# Patient Record
Sex: Female | Born: 1985 | Race: Black or African American | Hispanic: No | Marital: Single | Smoking: Current every day smoker
Health system: Southern US, Community
[De-identification: ages and names within clinical notes are randomized; demographics above are authoritative.]

## PROBLEM LIST (undated history)

## (undated) ENCOUNTER — Ambulatory Visit: Source: Home / Self Care

## (undated) DIAGNOSIS — D649 Anemia, unspecified: Secondary | ICD-10-CM

## (undated) HISTORY — DX: Anemia, unspecified: D64.9

## (undated) HISTORY — PX: DILATION AND CURETTAGE OF UTERUS: SHX78

---

## 2013-02-08 ENCOUNTER — Encounter (HOSPITAL_COMMUNITY): Payer: Self-pay | Admitting: Emergency Medicine

## 2013-02-08 ENCOUNTER — Emergency Department (INDEPENDENT_AMBULATORY_CARE_PROVIDER_SITE_OTHER)
Admission: EM | Admit: 2013-02-08 | Discharge: 2013-02-08 | Disposition: A | Discharge status: Home / Self Care | Payer: Self-pay | Source: Home / Self Care | Attending: Emergency Medicine | Admitting: Emergency Medicine

## 2013-02-08 DIAGNOSIS — L0291 Cutaneous abscess, unspecified: Secondary | ICD-10-CM

## 2013-02-08 DIAGNOSIS — L0390 Cellulitis, unspecified: Secondary | ICD-10-CM

## 2013-02-08 MED ORDER — HYDROCODONE-ACETAMINOPHEN 5-325 MG PO TABS
ORAL_TABLET | ORAL | Status: AC
  Filled 2013-02-08: qty 2

## 2013-02-08 MED ORDER — HYDROCODONE-ACETAMINOPHEN 5-325 MG PO TABS
2.0000 | ORAL_TABLET | Freq: Once | ORAL | Status: AC
  Administered 2013-02-08: 2 via ORAL

## 2013-02-08 MED ORDER — SULFAMETHOXAZOLE-TMP DS 800-160 MG PO TABS: 2.0000 | ORAL_TABLET | Freq: Two times a day (BID) | ORAL | Status: DC

## 2013-02-08 MED ORDER — OXYCODONE-ACETAMINOPHEN 5-325 MG PO TABS: ORAL_TABLET | ORAL | Status: DC

## 2013-02-08 NOTE — Discharge Instructions (Signed)

## 2013-02-08 NOTE — ED Notes (Signed)
Swollen, red area in right axilla area

## 2013-02-08 NOTE — ED Provider Notes (Signed)
  Chief Complaint   Chief Complaint  Patient presents with  . Cellulitis    History of Present Illness   Emily Rose is a 28 year old female who has had a two-day history of a painful abscess in her right axilla. She's had smaller abscesses in this area before, but they have not required any medical treatment. The current abscess has not drained and she denies any fever or chills. She denies any other skin lesions elsewhere on her body.  Review of Systems   Other than as noted above, the patient denies any of the following symptoms: Systemic:  No fever, chills or sweats. Skin:  No rash or itching.  [place]   Past medical history, family history, social history, meds, and allergies were reviewed.  No history of diabetes or prior history of abscesses or MRSA.  Physical Examination     Vital signs:  BP 105/41  Pulse 74  Temp(Src) 98.5 F (36.9 C) (Oral)  Resp 19  SpO2 96% Skin:  There is a large, tender abscess in the right axilla measuring 4 x 5 cm. This was fluctuant and tender but not draining.  Skin exam was otherwise normal.  No rash. Ext:  Distal pulses were full, patient has full ROM of all joints.  Procedure   Verbal informed consent was obtained.  The patient was informed of the risks and benefits of the procedure and understands and accepts.  A time out was called and the identity of the patient and correct procedure was confirmed.   The abscess area described above was prepped with Betadine and alcohol and anesthetized with ethyl chloride spray and 5 mL of 2% Xylocaine without epinephrine.  Using a #11 scalpel blade, a singe straight incision was made into the area of fluctulence, yielding a large amount of malodorous prurulent drainage.  Routine cultures were obtained.  Blunt dissection was used to break up loculations and the resulting wound cavity was packed with 1/4 inch Iodoform gauze.  A sterile pressure dressing was applied.  Course in [facility]   She  was given Norco 5/325 2 by mouth for pain.  Assessment   The encounter diagnosis was Abscess.  Plan     1.  Meds:  The following meds were prescribed:   Discharge Medication List as of 02/08/2013  3:53 PM    START taking these medications   Details  oxyCODONE-acetaminophen (PERCOCET) 5-325 MG per tablet 1 to 2 tablets every 6 hours as needed for pain., Print    sulfamethoxazole-trimethoprim (BACTRIM DS) 800-160 MG per tablet Take 2 tablets by mouth 2 (two) times daily., Starting 02/08/2013, Until Discontinued, Normal        2.  Patient Education/Counseling:  The patient was given appropriate handouts, self care instructions, and instructed in symptomatic relief.   3.  Follow up:  The patient was instructed to leave the dressing in place and return here again in 48 hours for packing removal, or sooner if becoming worse in any way, and given some red flag symptoms such as fever which would prompt immediate return.       Harden Mo, MD 02/08/13 2227

## 2013-02-10 ENCOUNTER — Emergency Department (INDEPENDENT_AMBULATORY_CARE_PROVIDER_SITE_OTHER)
Admission: EM | Admit: 2013-02-10 | Discharge: 2013-02-10 | Disposition: A | Discharge status: Home / Self Care | Payer: Self-pay | Source: Home / Self Care | Attending: Family Medicine | Admitting: Family Medicine

## 2013-02-10 ENCOUNTER — Encounter (HOSPITAL_COMMUNITY): Payer: Self-pay | Admitting: Emergency Medicine

## 2013-02-10 DIAGNOSIS — Z09 Encounter for follow-up examination after completed treatment for conditions other than malignant neoplasm: Secondary | ICD-10-CM

## 2013-02-10 NOTE — Discharge Instructions (Signed)
Warm compress twice a day when you take the antibiotic, take all of medicine, return as needed. °

## 2013-02-10 NOTE — ED Notes (Signed)
Patient to be seen for reevaluation of boil, right axilla.

## 2013-02-10 NOTE — ED Provider Notes (Signed)
CSN: [id number]     Arrival date & time 02/10/13  1435 History   None    Chief Complaint  Patient presents with  . Abscess   (Consider location/radiation/quality/duration/timing/severity/associated sxs/prior Treatment) Patient is a 28 y.o. female presenting with wound check. The history is provided by the patient.  Wound Check This is a new problem. The current episode started 2 days ago. The problem has been rapidly improving.    History reviewed. No pertinent past medical history. History reviewed. No pertinent past surgical history. No family history on file. History  Substance Use Topics  . Smoking status: Current Every Day Smoker  . Smokeless tobacco: Not on file  . Alcohol Use: Yes   OB History   Grav Para Term Preterm Abortions TAB SAB Ect Mult Living                 Review of Systems  Constitutional: Negative.   Skin: Positive for wound.    Allergies  Review of patient's allergies indicates no known allergies.  Home Medications   Current Outpatient Rx  Name  Route  Sig  Dispense  Refill  . oxyCODONE-acetaminophen (PERCOCET) 5-325 MG per tablet      1 to 2 tablets every 6 hours as needed for pain.   20 tablet   0   . sulfamethoxazole-trimethoprim (BACTRIM DS) 800-160 MG per tablet   Oral   Take 2 tablets by mouth 2 (two) times daily.   40 tablet   0    BP 123/73  Pulse 60  Temp(Src) 98.5 F (36.9 C) (Oral)  Resp 16  SpO2 98% Physical Exam  Nursing note and vitals reviewed. Constitutional: She is oriented to person, place, and time. She appears well-developed and well-nourished. No distress.  Neurological: She is alert and oriented to person, place, and time.  Skin: Skin is warm and dry.  Abscess packed right axilla, mod drainage on dsg.    ED Course  Procedures (including critical care time) Labs Review Labs Reviewed - No data to display Imaging Review No results found.  EKG Interpretation    Date/Time:    Ventricular Rate:    PR  Interval:    QRS Duration:   QT Interval:    QTC Calculation:   R Axis:     Text Interpretation:              MDM  Packing removed, no drainage, sx marked improved.    Billy Fischer, MD 02/13/13 [phone]

## 2013-02-11 LAB — CULTURE, ROUTINE-ABSCESS: SPECIAL REQUESTS: NORMAL

## 2013-02-12 NOTE — ED Notes (Addendum)
Abscess culture R arm: Few Proteus Mirabilis.  Pt. adequately treated with I and D and Bactrim DS. Roselyn Meier 02/12/2013

## 2013-04-27 ENCOUNTER — Other Ambulatory Visit (HOSPITAL_COMMUNITY)
Admission: RE | Admit: 2013-04-27 | Discharge: 2013-04-27 | Disposition: A | Discharge status: Home / Self Care | Payer: Self-pay | Source: Ambulatory Visit | Attending: Family Medicine | Admitting: Family Medicine

## 2013-04-27 ENCOUNTER — Encounter (HOSPITAL_COMMUNITY): Payer: Self-pay | Admitting: Emergency Medicine

## 2013-04-27 ENCOUNTER — Emergency Department (INDEPENDENT_AMBULATORY_CARE_PROVIDER_SITE_OTHER)
Admission: EM | Admit: 2013-04-27 | Discharge: 2013-04-27 | Disposition: A | Discharge status: Home / Self Care | Payer: Self-pay | Source: Home / Self Care | Attending: Family Medicine | Admitting: Family Medicine

## 2013-04-27 DIAGNOSIS — N390 Urinary tract infection, site not specified: Secondary | ICD-10-CM

## 2013-04-27 DIAGNOSIS — Z113 Encounter for screening for infections with a predominantly sexual mode of transmission: Secondary | ICD-10-CM | POA: Insufficient documentation

## 2013-04-27 DIAGNOSIS — N760 Acute vaginitis: Secondary | ICD-10-CM | POA: Insufficient documentation

## 2013-04-27 LAB — POCT URINALYSIS DIP (DEVICE)
BILIRUBIN URINE: NEGATIVE
Glucose, UA: NEGATIVE mg/dL
Ketones, ur: NEGATIVE mg/dL
NITRITE: NEGATIVE
Protein, ur: NEGATIVE mg/dL
Specific Gravity, Urine: 1.015 (ref 1.005–1.030)
Urobilinogen, UA: 0.2 mg/dL (ref 0.0–1.0)
pH: 8.5 — ABNORMAL HIGH (ref 5.0–8.0)

## 2013-04-27 LAB — POCT PREGNANCY, URINE: Preg Test, Ur: NEGATIVE

## 2013-04-27 MED ORDER — FLUCONAZOLE 150 MG PO TABS: 150.0000 mg | ORAL_TABLET | Freq: Once | ORAL | Status: DC

## 2013-04-27 MED ORDER — CEPHALEXIN 500 MG PO CAPS: 500.0000 mg | ORAL_CAPSULE | Freq: Four times a day (QID) | ORAL | Status: DC

## 2013-04-27 NOTE — ED Provider Notes (Signed)
CSN: [id number]     Arrival date & time 04/27/13  1223 History   First MD Initiated Contact with Patient 04/27/13 1505     Chief Complaint  Patient presents with  . Back Pain   (Consider location/radiation/quality/duration/timing/severity/associated sxs/prior Treatment) HPI Comments: Pt recently tx with bactrim for abscess. 4/4 period began, just prior to that pt was having thick white vaginal discharge that is different from her usual discharge. Denies vaginal itching. Began having dysuria 4/5.   Patient is a 28 y.o. female presenting with dysuria. The history is provided by the patient.  Dysuria Pain quality:  Burning Pain severity:  Moderate Onset quality:  Sudden Duration:  4 days Timing:  Intermittent Progression:  Worsening Chronicity:  New Recent urinary tract infections: no   Relieved by:  None tried Worsened by:  Nothing tried Ineffective treatments:  None tried Urinary symptoms: frequent urination   Urinary symptoms: no hematuria   Associated symptoms: abdominal pain, flank pain and vaginal discharge   Associated symptoms: no fever, no genital lesions, no nausea and no vomiting     History reviewed. No pertinent past medical history. History reviewed. No pertinent past surgical history. History reviewed. No pertinent family history. History  Substance Use Topics  . Smoking status: Current Every Day Smoker  . Smokeless tobacco: Not on file  . Alcohol Use: Yes   OB History   Grav Para Term Preterm Abortions TAB SAB Ect Mult Living                 Review of Systems  Constitutional: Negative for fever and chills.  Gastrointestinal: Positive for abdominal pain. Negative for nausea and vomiting.  Genitourinary: Positive for dysuria, frequency, flank pain and vaginal discharge. Negative for hematuria and genital sores.    Allergies  Review of patient's allergies indicates no known allergies.  Home Medications   Current Outpatient Rx  Name  Route  Sig  Dispense   Refill  . cephALEXin (KEFLEX) 500 MG capsule   Oral   Take 1 capsule (500 mg total) by mouth 4 (four) times daily.   20 capsule   0   . fluconazole (DIFLUCAN) 150 MG tablet   Oral   Take 1 tablet (150 mg total) by mouth once. May repeat in 1 week if needed.   2 tablet   0    BP 136/89  Pulse 60  Temp(Src) 98.2 F (36.8 C) (Oral)  Resp 12  SpO2 99%  LMP 04/22/2013 Physical Exam  Constitutional: She appears well-developed and well-nourished. She does not appear ill. No distress.  Cardiovascular: Normal rate and regular rhythm.   Pulmonary/Chest: Effort normal and breath sounds normal.  Abdominal: Normal appearance and bowel sounds are normal. She exhibits no distension. There is tenderness in the suprapubic area and left lower quadrant. There is no rigidity, no rebound, no guarding and no CVA tenderness.  Genitourinary: Uterus normal. There is no rash, tenderness or lesion on the right labia. There is no rash, tenderness or lesion on the left labia. Cervix exhibits no motion tenderness, no discharge and no friability. Right adnexum displays no mass and no tenderness. Left adnexum displays no mass and no tenderness. There is bleeding around the vagina. No erythema or tenderness around the vagina. No signs of injury around the vagina. No vaginal discharge found.  Lymphadenopathy:       Right: No inguinal adenopathy present.       Left: No inguinal adenopathy present.    ED Course  Procedures (  including critical care time) Labs Review Labs Reviewed  POCT URINALYSIS DIP (DEVICE) - Abnormal; Notable for the following:    Hgb urine dipstick LARGE (*)    pH 8.5 (*)    Leukocytes, UA MODERATE (*)    All other components within normal limits  POCT PREGNANCY, URINE  CERVICOVAGINAL ANCILLARY ONLY   Imaging Review No results found.   MDM   1. UTI (lower urinary tract infection)   rx keflex 500mg  QID # 20 for uti, rx diflucan 150mg  po x1, may repeat in 1 week prn #2. Diflucan is  because had sx of yeast prior to period started and because pt is about to start 2nd course of antibiotics in last couple of weeks.     Carvel Getting, NP 04/27/13 [phone]

## 2013-04-27 NOTE — Discharge Instructions (Signed)
Urinary Tract Infection  Urinary tract infections (UTIs) can develop anywhere along your urinary tract. Your urinary tract is your body's drainage system for removing wastes and extra water. Your urinary tract includes two kidneys, two ureters, a bladder, and a urethra. Your kidneys are a pair of bean-shaped organs. Each kidney is about the size of your fist. They are located below your ribs, one on each side of your spine.  CAUSES  Infections are caused by microbes, which are microscopic organisms, including fungi, viruses, and bacteria. These organisms are so small that they can only be seen through a microscope. Bacteria are the microbes that most commonly cause UTIs.  SYMPTOMS   Symptoms of UTIs may vary by age and gender of the patient and by the location of the infection. Symptoms in young women typically include a frequent and intense urge to urinate and a painful, burning feeling in the bladder or urethra during urination. Older women and men are more likely to be tired, shaky, and weak and have muscle aches and abdominal pain. A fever may mean the infection is in your kidneys. Other symptoms of a kidney infection include pain in your back or sides below the ribs, nausea, and vomiting.  DIAGNOSIS  To diagnose a UTI, your caregiver will ask you about your symptoms. Your caregiver also will ask to provide a urine sample. The urine sample will be tested for bacteria and white blood cells. White blood cells are made by your body to help fight infection.  TREATMENT   Typically, UTIs can be treated with medication. Because most UTIs are caused by a bacterial infection, they usually can be treated with the use of antibiotics. The choice of antibiotic and length of treatment depend on your symptoms and the type of bacteria causing your infection.  HOME CARE INSTRUCTIONS   If you were prescribed antibiotics, take them exactly as your caregiver instructs you. Finish the medication even if you feel better after you  have only taken some of the medication.   Drink enough water and fluids to keep your urine clear or pale yellow.   Avoid caffeine, tea, and carbonated beverages. They tend to irritate your bladder.   Empty your bladder often. Avoid holding urine for long periods of time.   Empty your bladder before and after sexual intercourse.   After a bowel movement, women should cleanse from front to back. Use each tissue only once.  SEEK MEDICAL CARE IF:    You have back pain.   You develop a fever.   Your symptoms do not begin to resolve within 3 days.  SEEK IMMEDIATE MEDICAL CARE IF:    You have severe back pain or lower abdominal pain.   You develop chills.   You have nausea or vomiting.   You have continued burning or discomfort with urination.  MAKE SURE YOU:    Understand these instructions.   Will watch your condition.   Will get help right away if you are not doing well or get worse.  Document Released: 10/15/2004 Document Revised: 07/07/2011 Document Reviewed: 02/13/2011  [employer] Patient Information 2014 [employer].

## 2013-04-27 NOTE — ED Provider Notes (Signed)
Medical screening examination/treatment/procedure(s) were performed by resident physician or non-physician practitioner and as supervising physician I was immediately available for consultation/collaboration.   KINDL,JAMES DOUGLAS MD.   James D Kindl, MD 04/27/13 1920 

## 2013-04-27 NOTE — ED Notes (Signed)
Currently bleeding, period started on Saturday 4/4.  Patient reports this is earlier than expected.  Burning with urination and uncomfortable feeling after urination.  Reports having white discharge prior to period.

## 2013-04-28 LAB — CERVICOVAGINAL ANCILLARY ONLY
CHLAMYDIA, DNA PROBE: NEGATIVE
NEISSERIA GONORRHEA: POSITIVE — AB
WET PREP (BD AFFIRM): NEGATIVE
WET PREP (BD AFFIRM): NEGATIVE
WET PREP (BD AFFIRM): POSITIVE — AB

## 2013-05-03 ENCOUNTER — Telehealth (HOSPITAL_COMMUNITY): Payer: Self-pay | Admitting: *Deleted

## 2013-05-03 NOTE — ED Notes (Addendum)
I called and left a message with pt.'s mother for pt. to call now or tomorrow between 2-5 P.  Call 1. Emily Rose 05/03/2013 Pt. called back and said she will come in for treatment.  Pt. instructed to notify her partner, no sex for 1 week after medication and to practice safe sex. Pt. told she can get HIV testing at the [facility]. STD clinic by appointment. 05/03/2013 Pt. came back to [facility] for treatment. DHHS form completed and faxed to the [facility] Department. 05/04/2013 HIV/RPR non-reactive. 05/05/2013

## 2013-05-03 NOTE — ED Notes (Signed)
GC pos., Chlamydia neg., Affrim: Candida and Trich neg., Gardnerella pos.  Message sent to [facility] NP and Dr. Juventino Slovak. [facility] 05/03/2013

## 2013-05-04 ENCOUNTER — Emergency Department (INDEPENDENT_AMBULATORY_CARE_PROVIDER_SITE_OTHER)
Admission: EM | Admit: 2013-05-04 | Discharge: 2013-05-04 | Disposition: A | Discharge status: Home / Self Care | Payer: Self-pay | Source: Home / Self Care | Attending: Family Medicine | Admitting: Family Medicine

## 2013-05-04 DIAGNOSIS — A5400 Gonococcal infection of lower genitourinary tract, unspecified: Secondary | ICD-10-CM

## 2013-05-04 DIAGNOSIS — B9689 Other specified bacterial agents as the cause of diseases classified elsewhere: Secondary | ICD-10-CM

## 2013-05-04 DIAGNOSIS — N760 Acute vaginitis: Secondary | ICD-10-CM

## 2013-05-04 DIAGNOSIS — A499 Bacterial infection, unspecified: Secondary | ICD-10-CM

## 2013-05-04 DIAGNOSIS — A549 Gonococcal infection, unspecified: Secondary | ICD-10-CM

## 2013-05-04 LAB — RPR

## 2013-05-04 LAB — HIV ANTIBODY (ROUTINE TESTING W REFLEX): HIV: NONREACTIVE

## 2013-05-04 MED ORDER — METRONIDAZOLE 500 MG PO TABS: 500.0000 mg | ORAL_TABLET | Freq: Two times a day (BID) | ORAL | Status: DC

## 2013-05-04 MED ORDER — AZITHROMYCIN 250 MG PO TABS
ORAL_TABLET | ORAL | Status: AC
  Filled 2013-05-04: qty 4

## 2013-05-04 MED ORDER — AZITHROMYCIN 250 MG PO TABS
1000.0000 mg | ORAL_TABLET | Freq: Once | ORAL | Status: AC
  Administered 2013-05-04: 1000 mg via ORAL

## 2013-05-04 MED ORDER — LIDOCAINE HCL (PF) 1 % IJ SOLN
INTRAMUSCULAR | Status: AC
  Filled 2013-05-04: qty 5

## 2013-05-04 MED ORDER — CEFTRIAXONE SODIUM 250 MG IJ SOLR
INTRAMUSCULAR | Status: AC
  Filled 2013-05-04: qty 250

## 2013-05-04 MED ORDER — CEFTRIAXONE SODIUM 250 MG IJ SOLR
250.0000 mg | Freq: Once | INTRAMUSCULAR | Status: AC
  Administered 2013-05-04: 250 mg via INTRAMUSCULAR

## 2013-05-04 NOTE — Discharge Instructions (Signed)
Thank you for coming in today. Please take metronidazole twice daily for one week. Do not take with alcohol. I will call you if any labs are positive. If your belly pain worsens, or you have high fever, bad vomiting, blood in your stool or black tarry stool go to the Emergency Room.   Bacterial Vaginosis Bacterial vaginosis is a vaginal infection that occurs when the normal balance of bacteria in the vagina is disrupted. It results from an overgrowth of certain bacteria. This is the most common vaginal infection in women of childbearing age. Treatment is important to prevent complications, especially in pregnant women, as it can cause a premature delivery. CAUSES  Bacterial vaginosis is caused by an increase in harmful bacteria that are normally present in smaller amounts in the vagina. Several different kinds of bacteria can cause bacterial vaginosis. However, the reason that the condition develops is not fully understood. RISK FACTORS Certain activities or behaviors can put you at an increased risk of developing bacterial vaginosis, including:  Having a new sex partner or multiple sex partners.  Douching.  Using an intrauterine device (IUD) for contraception. Women do not get bacterial vaginosis from toilet seats, bedding, swimming pools, or contact with objects around them. SIGNS AND SYMPTOMS  Some women with bacterial vaginosis have no signs or symptoms. Common symptoms include:  Grey vaginal discharge.  A fishlike odor with discharge, especially after sexual intercourse.  Itching or burning of the vagina and vulva.  Burning or pain with urination. DIAGNOSIS  Your health care provider will take a medical history and examine the vagina for signs of bacterial vaginosis. A sample of vaginal fluid may be taken. Your health care provider will look at this sample under a microscope to check for bacteria and abnormal cells. A vaginal pH test may also be done.  TREATMENT  Bacterial  vaginosis may be treated with antibiotic medicines. These may be given in the form of a pill or a vaginal cream. A second round of antibiotics may be prescribed if the condition comes back after treatment.  HOME CARE INSTRUCTIONS   Only take over-the-counter or prescription medicines as directed by your health care provider.  If antibiotic medicine was prescribed, take it as directed. Make sure you finish it even if you start to feel better.  Do not have sex until treatment is completed.  Tell all sexual partners that you have a vaginal infection. They should see their health care provider and be treated if they have problems, such as a mild rash or itching.  Practice safe sex by using condoms and only having one sex partner. SEEK MEDICAL CARE IF:   Your symptoms are not improving after 3 days of treatment.  You have increased discharge or pain.  You have a fever. MAKE SURE YOU:   Understand these instructions.  Will watch your condition.  Will get help right away if you are not doing well or get worse. FOR MORE INFORMATION  Centers for Disease Control and Prevention, Division of STD Prevention: [employer]raiser[employer]raud[employer] American Sexual Health Association (ASHA): www.ashastd.org  Document Released: 01/05/2005 Document Revised: 10/26/2012 Document Reviewed: 08/17/2012 [facility] Patient Information 2014 [place]. Gonorrhea Gonorrhea is an infection that can cause serious problems. If left untreated, may   Damage the female or female organs.   Cause women to be unable to have children (sterility).   Harm a fetus, if the infected woman is pregnant.  It is important to get treatment for gonorrhea as soon as possible. It is  also necessary that all your sexual partners be tested for the infection.  CAUSES  Gonorrhea is caused by bacteria called Neisseria gonorrhoeae. The infection is spread from person to person, usually by sexual contact (such as by anal, vaginal, or oral means). A  newborn can contract the infection from his or her mother during birth.  SYMPTOMS  Some people with gonorrhea do not have symptoms. Symptoms may be different in females and males.  Females The most common symptoms are:   Pain in the lower abdomen.   Fever with or without chills.  Other symptoms include:   Abnormal vaginal discharge.   Painful intercourse.   Burning or itching of the vagina or lips of the vagina.   Abnormal vaginal bleeding.   Pain when urinating.   Long-lasting (chronic) pain in the lower abdomen, especially during menstruation or intercourse.   Inability to become pregnant.   Going into premature labor.   Irritation, pain, bleeding, or discharge from the rectum. This may occur if the infection was spread by anal sex.   Sore throat or swollen neck lymph nodes. This may occur if the infection was spread by oral sex.  Males The most common symptoms are:   Discharge from the penis.   Pain or burning during urination.   Pain or swelling in the testicles. Other symptoms may include:   Irritation, pain, bleeding, or discharge from the rectum. This may occur if the infection was spread by anal sex.   Sore throat, fever, or swollen neck lymph nodes. This may occur if the infection was spread by oral sex.  DIAGNOSIS  A diagnosis is made after a physical exam is done and a sample of discharge is examined under a microscope for the presence of the bacteria. The discharge may be taken from the urethra, cervix, throat, or rectum.  TREATMENT  Gonorrhea is treated with antibiotic medicines. It is important for treatment to begin as soon as possible. Early treatment may prevent some problems from developing.  HOME CARE INSTRUCTIONS   Only take over-the-counter or prescription medicines for pain, fever, or discomfort as directed by your health care provider.   Take antibiotics as directed. Make sure you finish them even if you start to feel  better. Incomplete treatment will put you at risk for continued infection.   Do not have sex until treatment is complete or as directed by your health care provider.   Follow up with your health care provider as directed.   Not all test results are available during your visit. If your test results are not back during the visit, make an appointment with your health care provider to find out the results. Do not assume everything is normal if you have not heard from your health care provider or the medical facility. It is important for you to follow up on all of your test results.   If you test positive for gonorrhea, inform your recent sexual partners. They need to be checked for gonorrhea even if they do not have symptoms. They may need treatment, even if they test negative for gonorrhea.  SEEK MEDICAL CARE IF:   You develop any bad reaction to the medicine you were prescribed. This may include:   A rash.   Nausea.   Vomiting.   Diarrhea.   Your symptoms do not improve after a few days of taking antibiotics.   Your symptoms get worse.   You develop increased pain, such as in the testicles (for males) or in  the abdomen (for females).  SEEK IMMEDIATE MEDICAL CARE IF:  You have a fever or persistent symptoms for more than 2 3 days.   You have a fever and your symptoms suddenly get worse.  MAKE SURE YOU:   Understand these instructions.  Will watch your condition.  Will get help right away if you are not doing well or get worse. Document Released: 01/03/2000 Document Revised: 10/26/2012 Document Reviewed: 07/13/2012 [facility] Patient Information 2014 [place].

## 2013-05-04 NOTE — ED Provider Notes (Signed)
Emily Rose is a 28 y.o. female who presents to Urgent Care today for gonorrhea. Patient was seen about one week ago for vaginal discharge and was diagnosed with gonorrhea and BV based on cytology. She was asked to followup today for treatment. Her vaginal discharge is still present. She has not taken fluconazole. No fevers or chills nausea vomiting or diarrhea.   No past medical history on file. History  Substance Use Topics  . Smoking status: Current Every Day Smoker  . Smokeless tobacco: Not on file  . Alcohol Use: Yes   ROS as above Medications: No current facility-administered medications for this encounter.   Current Outpatient Prescriptions  Medication Sig Dispense Refill  . metroNIDAZOLE (FLAGYL) 500 MG tablet Take 1 tablet (500 mg total) by mouth 2 (two) times daily.  14 tablet  0    Exam:  BP 126/88  Pulse 60  Temp(Src) 98 F (36.7 C) (Oral)  Resp 18  SpO2 100%  LMP 04/22/2013 Gen: Well NAD HEENT: EOMI,  MMM Lungs: Normal work of breathing. CTABL Heart: RRR no MRG Abd: NABS, Soft. NT, ND Exts: Brisk capillary refill, warm and well perfused.   No results found for this or any previous visit (from the past 24 hour(s)). No results found.  Assessment and Plan: 28 y.o. female with diarrhea and bacterial vaginosis. Treatment for diarrhea with ceftriaxone and azithromycin in clinic today. Prescription metronidazole for BV provided. HIV and RPR pending.   Discussed warning signs or symptoms. Please see discharge instructions. Patient expresses understanding.    Gregor Hams, MD 05/04/13 [phone]

## 2013-05-04 NOTE — ED Notes (Signed)
Here for follow up on STD results and receive antibiotic shot

## 2013-11-26 ENCOUNTER — Emergency Department (HOSPITAL_COMMUNITY)
Admission: EM | Admit: 2013-11-26 | Discharge: 2013-11-26 | Disposition: A | Discharge status: Home / Self Care | Payer: Self-pay | Source: Home / Self Care | Attending: Family Medicine | Admitting: Family Medicine

## 2013-11-26 ENCOUNTER — Encounter (HOSPITAL_COMMUNITY): Payer: Self-pay | Admitting: *Deleted

## 2013-11-26 ENCOUNTER — Other Ambulatory Visit (HOSPITAL_COMMUNITY)
Admission: RE | Admit: 2013-11-26 | Discharge: 2013-11-26 | Disposition: A | Discharge status: Home / Self Care | Payer: Self-pay | Source: Ambulatory Visit | Attending: Family Medicine | Admitting: Family Medicine

## 2013-11-26 DIAGNOSIS — B9689 Other specified bacterial agents as the cause of diseases classified elsewhere: Secondary | ICD-10-CM

## 2013-11-26 DIAGNOSIS — N760 Acute vaginitis: Secondary | ICD-10-CM

## 2013-11-26 DIAGNOSIS — Z113 Encounter for screening for infections with a predominantly sexual mode of transmission: Secondary | ICD-10-CM | POA: Insufficient documentation

## 2013-11-26 DIAGNOSIS — A499 Bacterial infection, unspecified: Secondary | ICD-10-CM

## 2013-11-26 LAB — POCT URINALYSIS DIP (DEVICE)
Glucose, UA: NEGATIVE mg/dL
Ketones, ur: NEGATIVE mg/dL
LEUKOCYTES UA: NEGATIVE
NITRITE: NEGATIVE
PROTEIN: 30 mg/dL — AB
Specific Gravity, Urine: >=1.03 (ref 1.005–1.030)
Urobilinogen, UA: 1 mg/dL (ref 0.0–1.0)
pH: 5.5 (ref 5.0–8.0)

## 2013-11-26 LAB — POCT PREGNANCY, URINE: Preg Test, Ur: NEGATIVE

## 2013-11-26 MED ORDER — FLUCONAZOLE 150 MG PO TABS: 150.0000 mg | ORAL_TABLET | Freq: Once | ORAL | Status: DC

## 2013-11-26 MED ORDER — METRONIDAZOLE 500 MG PO TABS: 500.0000 mg | ORAL_TABLET | Freq: Two times a day (BID) | ORAL | Status: DC

## 2013-11-26 NOTE — Discharge Instructions (Signed)
We will call with positive test results and treat as indicated  °

## 2013-11-26 NOTE — ED Notes (Signed)
Pt  Reports    Symptoms  Of             Vaginal  Discharge        With  A   Foul odor      Symptoms      Since  Yesterday              Ambulated          To  Room  With  A  Fluid

## 2013-11-26 NOTE — ED Provider Notes (Signed)
CSN: [id number]     Arrival date & time 11/26/13  1138 History   First MD Initiated Contact with Patient 11/26/13 1211     Chief Complaint  Patient presents with  . Vaginal Discharge   (Consider location/radiation/quality/duration/timing/severity/associated sxs/prior Treatment) Patient is a 28 y.o. female presenting with vaginal discharge. The history is provided by the patient.  Vaginal Discharge Quality:  Malodorous and white (started on nl menses yest.) Severity:  Mild Onset quality:  Gradual Duration:  1 week Progression:  Worsening Chronicity:  New Relieved by:  None tried Worsened by:  Nothing tried Associated symptoms: no dysuria     History reviewed. No pertinent past medical history. History reviewed. No pertinent past surgical history. History reviewed. No pertinent family history. History  Substance Use Topics  . Smoking status: Current Every Day Smoker  . Smokeless tobacco: Not on file  . Alcohol Use: Yes   OB History    No data available     Review of Systems  Constitutional: Negative.   Gastrointestinal: Negative.   Genitourinary: Positive for vaginal discharge. Negative for dysuria, frequency, vaginal pain and menstrual problem.    Allergies  Review of patient's allergies indicates no known allergies.  Home Medications   Prior to Admission medications   Medication Sig Start Date End Date Taking? Authorizing Provider  fluconazole (DIFLUCAN) 150 MG tablet Take 1 tablet (150 mg total) by mouth once. May repeat in 1 week. 11/26/13   Billy Fischer, MD  metroNIDAZOLE (FLAGYL) 500 MG tablet Take 1 tablet (500 mg total) by mouth 2 (two) times daily. 11/26/13   Billy Fischer, MD   BP 115/77 mmHg  Pulse 83  Temp(Src) 97.2 F (36.2 C) (Oral)  Resp 18  SpO2 99% Physical Exam  Constitutional: She is oriented to person, place, and time. She appears well-developed and well-nourished.  Abdominal: Soft. Bowel sounds are normal. There is no tenderness.   Genitourinary: There is bleeding in the vagina. No erythema or tenderness in the vagina. No signs of injury around the vagina. No vaginal discharge found.  Neurological: She is alert and oriented to person, place, and time.  Skin: Skin is warm and dry.  Nursing note and vitals reviewed.   ED Course  Procedures (including critical care time) Labs Review Labs Reviewed  POCT URINALYSIS DIP (DEVICE) - Abnormal; Notable for the following:    Bilirubin Urine SMALL (*)    Hgb urine dipstick LARGE (*)    Protein, ur 30 (*)    All other components within normal limits  POCT PREGNANCY, URINE    Imaging Review No results found.   MDM   1. BV (bacterial vaginosis)        Billy Fischer, MD 11/26/13 1239

## 2013-11-27 LAB — CERVICOVAGINAL ANCILLARY ONLY
Chlamydia: NEGATIVE
NEISSERIA GONORRHEA: NEGATIVE

## 2014-07-12 ENCOUNTER — Emergency Department (HOSPITAL_COMMUNITY)
Admission: EM | Admit: 2014-07-12 | Discharge: 2014-07-12 | Disposition: A | Discharge status: Home / Self Care | Payer: Self-pay

## 2015-08-23 ENCOUNTER — Ambulatory Visit (HOSPITAL_COMMUNITY)
Admission: EM | Admit: 2015-08-23 | Discharge: 2015-08-23 | Disposition: A | Discharge status: Home / Self Care | Payer: Self-pay | Attending: Emergency Medicine | Admitting: Emergency Medicine

## 2015-08-23 ENCOUNTER — Encounter (HOSPITAL_COMMUNITY): Payer: Self-pay | Admitting: Emergency Medicine

## 2015-08-23 DIAGNOSIS — N390 Urinary tract infection, site not specified: Secondary | ICD-10-CM | POA: Insufficient documentation

## 2015-08-23 DIAGNOSIS — F17200 Nicotine dependence, unspecified, uncomplicated: Secondary | ICD-10-CM | POA: Insufficient documentation

## 2015-08-23 MED ORDER — CEPHALEXIN 500 MG PO CAPS: 500.0000 mg | ORAL_CAPSULE | Freq: Four times a day (QID) | ORAL | 0 refills | Status: DC

## 2015-08-23 MED ORDER — PHENAZOPYRIDINE HCL 200 MG PO TABS: 200.0000 mg | ORAL_TABLET | Freq: Three times a day (TID) | ORAL | 0 refills | Status: DC

## 2015-08-23 NOTE — ED Triage Notes (Signed)
PT reports burning urination for 3 days.

## 2015-08-23 NOTE — ED Provider Notes (Signed)
[place]    CSN: [id number] Arrival date & time: 08/23/15  1424  First Provider Contact:  First MD Initiated Contact with Patient 08/23/15 1446        History   Chief Complaint Chief Complaint  Patient presents with  . Urinary Tract Infection    HPI Emily Rose is a 30 y.o. female.   She is a 30 year old woman here for evaluation of dysuria. Her symptoms started about 3 days ago. She reports urinary frequency and urgency as well as dysuria. She reports some lower abdominal discomfort. She did see some blood in her urine today. No back pain. No nausea or vomiting. No fevers or chills. No vaginal symptoms. She states she and her partner have been trying for a year to have a baby without success.    History reviewed. No pertinent past medical history.  There are no active problems to display for this patient.   History reviewed. No pertinent surgical history.  OB History    No data available       Home Medications    Prior to Admission medications   Medication Sig Start Date End Date Taking? Authorizing Provider  cephALEXin (KEFLEX) 500 MG capsule Take 1 capsule (500 mg total) by mouth 4 (four) times daily. 08/23/15   Melony Overly, MD  phenazopyridine (PYRIDIUM) 200 MG tablet Take 1 tablet (200 mg total) by mouth 3 (three) times daily. 08/23/15   Melony Overly, MD    Family History No family history on file.  Social History Social History  Substance Use Topics  . Smoking status: Current Every Day Smoker  . Smokeless tobacco: Never Used  . Alcohol use Yes     Allergies   Iodine   Review of Systems Review of Systems  Constitutional: Negative for fever.  Gastrointestinal: Positive for abdominal pain. Negative for nausea and vomiting.  Genitourinary: Positive for dysuria, frequency and urgency. Negative for flank pain and vaginal discharge.     Physical Exam Triage Vital Signs ED Triage Vitals  Enc Vitals Group     BP 08/23/15 1451 116/71       Pulse Rate 08/23/15 1451 87     Resp 08/23/15 1451 18     Temp 08/23/15 1451 98.1 F (36.7 C)     Temp Source 08/23/15 1451 Oral     SpO2 08/23/15 1451 100 %     Weight --      Height --      Head Circumference --      Peak Flow --      Pain Score 08/23/15 1511 6     Pain Loc --      Pain Edu? --      Excl. in [place]? --    No data found.   Updated Vital Signs BP 116/71 (BP Location: Left Arm)   Pulse 87   Temp 98.1 F (36.7 C) (Oral)   Resp 18   SpO2 100%   Visual Acuity Right Eye Distance:   Left Eye Distance:   Bilateral Distance:    Right Eye Near:   Left Eye Near:    Bilateral Near:     Physical Exam  Constitutional: She is oriented to person, place, and time. She appears well-developed and well-nourished. No distress.  Cardiovascular: Normal rate.   Pulmonary/Chest: Effort normal.  Abdominal: Soft. She exhibits no distension. There is tenderness (mild in suprapubic). There is no guarding.  No CVA tenderness  Neurological: She is alert and  oriented to person, place, and time.     UC Treatments / Results  Labs (all labs ordered are listed, but only abnormal results are displayed) Labs Reviewed  URINE CULTURE  Urine pregnancy is negative. Point-of-care urinalysis was positive for leukocytes, blood, and was concentrated.  EKG  EKG Interpretation None       Radiology No results found.  Procedures Procedures (including critical care time)  Medications Ordered in UC Medications - No data to display   Initial Impression / Assessment and Plan / UC Course  I have reviewed the triage vital signs and the nursing notes.  Pertinent labs & imaging results that were available during my care of the patient were reviewed by me and considered in my medical decision making (see chart for details).  Clinical Course    History and UA consistent with UTI. Urine sent for culture. Treat with Keflex and Pyridium. Discussed that they will need to see OB/GYN  to discuss fertility issues.   Final Clinical Impressions(s) / UC Diagnoses   Final diagnoses:  UTI (lower urinary tract infection)    New Prescriptions New Prescriptions   CEPHALEXIN (KEFLEX) 500 MG CAPSULE    Take 1 capsule (500 mg total) by mouth 4 (four) times daily.   PHENAZOPYRIDINE (PYRIDIUM) 200 MG TABLET    Take 1 tablet (200 mg total) by mouth 3 (three) times daily.     Melony Overly, MD 08/23/15 [phone]

## 2015-08-23 NOTE — Discharge Instructions (Signed)
You have a UTI. Take Keflex 4 times a day for 3 days. Use the Pyridium 3 times a day for the next 2 days. This will turn your urine bright orange. We will call if we need to change antibiotics based on culture results. Follow-up with an OB/GYN for infertility evaluation.

## 2015-08-25 LAB — URINE CULTURE

## 2015-12-31 ENCOUNTER — Ambulatory Visit (HOSPITAL_COMMUNITY)
Admission: EM | Admit: 2015-12-31 | Discharge: 2015-12-31 | Disposition: A | Discharge status: Home / Self Care | Payer: Self-pay | Attending: Family Medicine | Admitting: Family Medicine

## 2015-12-31 ENCOUNTER — Encounter (HOSPITAL_COMMUNITY): Payer: Self-pay | Admitting: Emergency Medicine

## 2015-12-31 DIAGNOSIS — H1012 Acute atopic conjunctivitis, left eye: Secondary | ICD-10-CM

## 2015-12-31 MED ORDER — TOBRAMYCIN 0.3 % OP SOLN: 1.0000 [drp] | Freq: Four times a day (QID) | OPHTHALMIC | 0 refills | Status: DC

## 2015-12-31 MED ORDER — MOMETASONE FUROATE 0.1 % EX CREA: 1.0000 "application " | TOPICAL_CREAM | Freq: Every day | CUTANEOUS | 0 refills | Status: DC

## 2015-12-31 NOTE — ED Provider Notes (Signed)
[place]    CSN: [id number] Arrival date & time: 12/31/15  1122     History   Chief Complaint Chief Complaint  Patient presents with  . Eye Pain    HPI Emily Rose is a 30 y.o. female.   The history is provided by the patient.  Eye Pain  This is a new problem. The current episode started yesterday. The problem has been gradually improving. Pertinent negatives include no chest pain, no abdominal pain, no headaches and no shortness of breath.    History reviewed. No pertinent past medical history.  There are no active problems to display for this patient.   History reviewed. No pertinent surgical history.  OB History    No data available       Home Medications    Prior to Admission medications   Medication Sig Start Date End Date Taking? Authorizing Provider  cephALEXin (KEFLEX) 500 MG capsule Take 1 capsule (500 mg total) by mouth 4 (four) times daily. Patient not taking: Reported on 12/31/2015 08/23/15   Melony Overly, MD  phenazopyridine (PYRIDIUM) 200 MG tablet Take 1 tablet (200 mg total) by mouth 3 (three) times daily. 08/23/15   Melony Overly, MD    Family History No family history on file.  Social History Social History  Substance Use Topics  . Smoking status: Current Every Day Smoker  . Smokeless tobacco: Never Used  . Alcohol use Yes     Allergies   Iodine   Review of Systems Review of Systems  Constitutional: Negative.   HENT: Positive for congestion, postnasal drip and rhinorrhea.   Eyes: Positive for pain, discharge, redness and itching. Negative for photophobia and visual disturbance.  Respiratory: Negative for shortness of breath.   Cardiovascular: Negative for chest pain.  Gastrointestinal: Negative for abdominal pain.  Neurological: Negative for headaches.  All other systems reviewed and are negative.    Physical Exam Triage Vital Signs ED Triage Vitals [12/31/15 1215]  Enc Vitals Group     BP 121/85     Pulse  Rate 74     Resp 16     Temp 98.2 F (36.8 C)     Temp Source Oral     SpO2 100 %     Weight      Height      Head Circumference      Peak Flow      Pain Score      Pain Loc      Pain Edu?      Excl. in [place]?    No data found.   Updated Vital Signs BP 121/85 (BP Location: Left Arm)   Pulse 74   Temp 98.2 F (36.8 C) (Oral)   Resp 16   LMP 12/19/2015   SpO2 100%   Visual Acuity Right Eye Distance:   Left Eye Distance:   Bilateral Distance:    Right Eye Near:   Left Eye Near:    Bilateral Near:     Physical Exam  Constitutional: She appears well-developed and well-nourished. No distress.  HENT:  Right Ear: External ear normal.  Left Ear: External ear normal.  Nose: Nose normal.  Mouth/Throat: Oropharynx is clear and moist.  Eyes: EOM are normal. Pupils are equal, round, and reactive to light. Left eye exhibits chemosis. Right conjunctiva is not injected. Left conjunctiva is injected.    Nursing note and vitals reviewed.    UC Treatments / Results  Labs (all labs ordered are  listed, but only abnormal results are displayed) Labs Reviewed - No data to display  EKG  EKG Interpretation None       Radiology No results found.  Procedures Procedures (including critical care time)  Medications Ordered in UC Medications - No data to display   Initial Impression / Assessment and Plan / UC Course  I have reviewed the triage vital signs and the nursing notes.  Pertinent labs & imaging results that were available during my care of the patient were reviewed by me and considered in my medical decision making (see chart for details).  Clinical Course       Final Clinical Impressions(s) / UC Diagnoses   Final diagnoses:  None    New Prescriptions New Prescriptions   No medications on file     Billy Fischer, MD 12/31/15 1307

## 2015-12-31 NOTE — ED Triage Notes (Signed)
Left eye puffiness this morning.  No pain.  Left eye itches below eye.  Patient reports crusty drainage around eye this morning.  Denies vision changes.  Patient does not wear contacts.

## 2016-07-31 ENCOUNTER — Ambulatory Visit (HOSPITAL_COMMUNITY)
Admission: EM | Admit: 2016-07-31 | Discharge: 2016-07-31 | Disposition: A | Discharge status: Home / Self Care | Payer: Self-pay | Attending: Internal Medicine | Admitting: Internal Medicine

## 2016-07-31 ENCOUNTER — Encounter (HOSPITAL_COMMUNITY): Payer: Self-pay | Admitting: Emergency Medicine

## 2016-07-31 DIAGNOSIS — B9689 Other specified bacterial agents as the cause of diseases classified elsewhere: Secondary | ICD-10-CM | POA: Insufficient documentation

## 2016-07-31 DIAGNOSIS — N760 Acute vaginitis: Secondary | ICD-10-CM | POA: Insufficient documentation

## 2016-07-31 DIAGNOSIS — N898 Other specified noninflammatory disorders of vagina: Secondary | ICD-10-CM

## 2016-07-31 DIAGNOSIS — Z113 Encounter for screening for infections with a predominantly sexual mode of transmission: Secondary | ICD-10-CM

## 2016-07-31 DIAGNOSIS — A5901 Trichomonal vulvovaginitis: Secondary | ICD-10-CM | POA: Insufficient documentation

## 2016-07-31 DIAGNOSIS — Z202 Contact with and (suspected) exposure to infections with a predominantly sexual mode of transmission: Secondary | ICD-10-CM

## 2016-07-31 MED ORDER — METRONIDAZOLE 500 MG PO TABS: 500.0000 mg | ORAL_TABLET | Freq: Two times a day (BID) | ORAL | 0 refills | Status: AC

## 2016-07-31 MED ORDER — FLUCONAZOLE 150 MG PO TABS: 150.0000 mg | ORAL_TABLET | Freq: Once | ORAL | 0 refills | Status: AC

## 2016-07-31 NOTE — ED Triage Notes (Signed)
Here for white vag d/c onset 5 days associated w/foul smell  Denies fevers, n/v/d, abd pain, urinary sx  Sexually active in a monogamous relationship w/no condom use... No STD concerns  A&O x4... NAD... Ambulatory

## 2016-07-31 NOTE — Discharge Instructions (Signed)
Recommend start Flagyl 500mg  twice a day as directed. No alcohol while on medication. Take Diflucan yeast pill- 1 now, repeat 1 tablet in 3 to 4 days if needed. No sexual intercourse for at least 7 days. Use condoms with future sexual encounters. Follow-up pending lab results.

## 2016-07-31 NOTE — ED Provider Notes (Signed)
CSN: [id number]     Arrival date & time 07/31/16  1219 History   First MD Initiated Contact with Patient 07/31/16 1423     Chief Complaint  Patient presents with  . Vaginal Discharge   (Consider location/radiation/quality/duration/timing/severity/associated sxs/prior Treatment) 31 year old female presents with thin white vaginal discharge with odor for the past 5 days. Minimal itching. Noticed this discharge right after her period ended. Also recently douched. Denies any fever, dysuria, abdominal, pelvic or back pain. Has history of BV. Currently 1 partner for the past 2 years. Does not use condoms. Requests testing for STD's. No other chronic health issues. Takes no daily medication.    The history is provided by the patient.    History reviewed. No pertinent past medical history. History reviewed. No pertinent surgical history. History reviewed. No pertinent family history. Social History  Substance Use Topics  . Smoking status: Current Every Day Smoker  . Smokeless tobacco: Never Used  . Alcohol use Yes   OB History    No data available     Review of Systems  Constitutional: Negative for appetite change, chills, fatigue and fever.  HENT: Negative for sore throat and trouble swallowing.   Respiratory: Negative for cough and shortness of breath.   Cardiovascular: Negative for chest pain.  Gastrointestinal: Negative for abdominal pain, blood in stool, diarrhea, nausea and vomiting.  Genitourinary: Positive for vaginal discharge. Negative for difficulty urinating, dyspareunia, dysuria, flank pain, frequency, genital sores, hematuria, pelvic pain, urgency, vaginal bleeding and vaginal pain.  Musculoskeletal: Negative for arthralgias, back pain, myalgias and neck pain.  Skin: Negative for rash and wound.  Allergic/Immunologic: Negative for immunocompromised state.  Neurological: Negative for dizziness, seizures, syncope, weakness, numbness and headaches.  Hematological: Negative  for adenopathy. Does not bruise/bleed easily.    Allergies  Iodine and Shellfish allergy  Home Medications   Prior to Admission medications   Medication Sig Start Date End Date Taking? Authorizing Provider  fluconazole (DIFLUCAN) 150 MG tablet Take 1 tablet (150 mg total) by mouth once. May repeat 1 tablet in 3 to 4 days if needed 07/31/16 07/31/16  Katy Apo, NP  metroNIDAZOLE (FLAGYL) 500 MG tablet Take 1 tablet (500 mg total) by mouth 2 (two) times daily. No alcohol 07/31/16 08/07/16  Katy Apo, NP   Meds Ordered and Administered this Visit  Medications - No data to display  BP 124/89 (BP Location: Right Arm)   Pulse 65   Temp 98.7 F (37.1 C) (Oral)   Resp 20   LMP 07/26/2016   SpO2 100%  No data found.   Physical Exam  Constitutional: She is oriented to person, place, and time. She appears well-developed and well-nourished. No distress.  HENT:  Head: Normocephalic and atraumatic.  Right Ear: External ear normal.  Left Ear: External ear normal.  Mouth/Throat: Oropharynx is clear and moist.  Neck: Normal range of motion. Neck supple.  Cardiovascular: Normal rate, regular rhythm and normal heart sounds.   No murmur heard. Pulmonary/Chest: Effort normal and breath sounds normal. No respiratory distress.  Abdominal: Soft. Normal appearance and bowel sounds are normal. There is no hepatosplenomegaly. There is no tenderness. There is no rigidity, no rebound, no guarding and no CVA tenderness.  Genitourinary: Pelvic exam was performed with patient in the knee-chest position. No labial fusion. There is no rash, tenderness, lesion or injury on the right labia. There is no rash, tenderness, lesion or injury on the left labia. Cervix exhibits no motion tenderness and  no friability. Right adnexum displays no tenderness. Left adnexum displays no tenderness. No erythema, tenderness or bleeding in the vagina. No foreign body in the vagina. Vaginal discharge (white, thin with  fishy odor) found.  Musculoskeletal: Normal range of motion.  Lymphadenopathy:    She has no cervical adenopathy.       Right: No inguinal adenopathy present.       Left: No inguinal adenopathy present.  Neurological: She is alert and oriented to person, place, and time.  Skin: Skin is warm and dry. No rash noted.  Psychiatric: She has a normal mood and affect. Her behavior is normal. Judgment and thought content normal.    Urgent Care Course     Procedures (including critical care time)  Labs Review Labs Reviewed  CERVICOVAGINAL ANCILLARY ONLY    Imaging Review No results found.   Visual Acuity Review  Right Eye Distance:   Left Eye Distance:   Bilateral Distance:    Right Eye Near:   Left Eye Near:    Bilateral Near:         MDM   1. Vaginal discharge   2. Potential exposure to STD    Discussed that she may have BV again but could have other infections. Will start treatment for BV- Flagyl 500mg  twice a day as directed. No alcohol while on medication. Recommend take Diflucan 150mg  one tablet now, repeat 1 tablet in 3 to 4 days. She wants to wait on any additional treatment pending lab results. No sexual intercourse for at least 7 days. Use condoms with any future sexual encounters. Follow-up pending labwork.     Katy Apo, NP 07/31/16 1929

## 2016-08-03 LAB — CERVICOVAGINAL ANCILLARY ONLY
Bacterial vaginitis: POSITIVE — AB
Candida vaginitis: NEGATIVE
Chlamydia: NEGATIVE
NEISSERIA GONORRHEA: NEGATIVE
TRICH (WINDOWPATH): POSITIVE — AB

## 2016-09-05 ENCOUNTER — Encounter (HOSPITAL_COMMUNITY): Payer: Self-pay | Admitting: Emergency Medicine

## 2016-09-05 ENCOUNTER — Ambulatory Visit (HOSPITAL_COMMUNITY)
Admission: EM | Admit: 2016-09-05 | Discharge: 2016-09-05 | Disposition: A | Discharge status: Home / Self Care | Payer: Self-pay | Attending: Emergency Medicine | Admitting: Emergency Medicine

## 2016-09-05 DIAGNOSIS — L02411 Cutaneous abscess of right axilla: Secondary | ICD-10-CM

## 2016-09-05 MED ORDER — HYDROCODONE-ACETAMINOPHEN 5-325 MG PO TABS: 1.0000 | ORAL_TABLET | ORAL | 0 refills | Status: DC | PRN

## 2016-09-05 MED ORDER — SULFAMETHOXAZOLE-TRIMETHOPRIM 800-160 MG PO TABS: 1.0000 | ORAL_TABLET | Freq: Two times a day (BID) | ORAL | 0 refills | Status: AC

## 2016-09-05 MED ORDER — LIDOCAINE-EPINEPHRINE (PF) 2 %-1:200000 IJ SOLN
INTRAMUSCULAR | Status: AC
  Filled 2016-09-05: qty 20

## 2016-09-05 NOTE — ED Notes (Signed)
Right axillary cleaned with soap and water. 4x4s placed with paper tape. Patient given supplies for home dressing changes.

## 2016-09-05 NOTE — ED Provider Notes (Signed)
[place]    CSN: [id number] Arrival date & time: 09/05/16  1210     History   Chief Complaint Chief Complaint  Patient presents with  . Abscess    HPI Emily Rose is a 31 y.o. female.   31 year old female complaining of painful tender raised bump to the right axilla extending in to the upper arm. She states it started just a few days ago but much worse today. It had been draining earlier this week and seemed to the been getting better until yesterday when it started to increase in size with surrounding erythema.      History reviewed. No pertinent past medical history.  There are no active problems to display for this patient.   History reviewed. No pertinent surgical history.  OB History    No data available       Home Medications    Prior to Admission medications   Medication Sig Start Date End Date Taking? Authorizing Provider  HYDROcodone-acetaminophen (NORCO/VICODIN) 5-325 MG tablet Take 1 tablet by mouth every 4 (four) hours as needed. 09/05/16   Janne Napoleon, NP  sulfamethoxazole-trimethoprim (BACTRIM DS,SEPTRA DS) 800-160 MG tablet Take 1 tablet by mouth 2 (two) times daily. 09/05/16 09/12/16  Janne Napoleon, NP    Family History History reviewed. No pertinent family history.  Social History Social History  Substance Use Topics  . Smoking status: Current Every Day Smoker  . Smokeless tobacco: Never Used  . Alcohol use Yes     Allergies   Iodine and Shellfish allergy   Review of Systems Review of Systems  Constitutional: Negative.   Musculoskeletal: Negative.   Skin:       As per history of present illness  Neurological: Negative.   All other systems reviewed and are negative.    Physical Exam Triage Vital Signs ED Triage Vitals  Enc Vitals Group     BP 09/05/16 1244 135/78     Pulse Rate 09/05/16 1244 74     Resp 09/05/16 1244 16     Temp 09/05/16 1244 98.4 F (36.9 C)     Temp Source 09/05/16 1244 Oral     SpO2  09/05/16 1244 100 %     Weight --      Height --      Head Circumference --      Peak Flow --      Pain Score 09/05/16 1245 7     Pain Loc --      Pain Edu? --      Excl. in [place]? --    No data found.   Updated Vital Signs BP 135/78 (BP Location: Left Arm)   Pulse 74   Temp 98.4 F (36.9 C) (Oral)   Resp 16   LMP 08/19/2016   SpO2 100%   Visual Acuity Right Eye Distance:   Left Eye Distance:   Bilateral Distance:    Right Eye Near:   Left Eye Near:    Bilateral Near:     Physical Exam  Constitutional: She is oriented to person, place, and time. She appears well-developed and well-nourished. No distress.  Eyes: EOM are normal.  Neck: Normal range of motion. Neck supple.  Cardiovascular: Normal rate.   Pulmonary/Chest: Effort normal. No respiratory distress.  Musculoskeletal: She exhibits no edema.  Neurological: She is alert and oriented to person, place, and time. She exhibits normal muscle tone.  Skin: Skin is warm and dry.  Tooth in joint abscesses in the right axilla  at the crease and proximal most upper arm. There is surrounding erythema/cellulitis. Positive for tenderness.  Psychiatric: She has a normal mood and affect.  Nursing note and vitals reviewed.    UC Treatments / Results  Labs (all labs ordered are listed, but only abnormal results are displayed) Labs Reviewed - No data to display  EKG  EKG Interpretation None       Radiology No results found.  Procedures .Marland KitchenIncision and Drainage Date/Time: 09/05/2016 2:20 PM Performed by: Marcha Dutton, Aniyha Tate Authorized by: Marcha Dutton, Rakiyah Esch   Consent:    Consent obtained:  Verbal   Consent given by:  Patient   Risks discussed:  Bleeding, pain and infection Location:    Type:  Abscess   Location:  Upper extremity   Upper extremity location:  Arm   Arm location:  R upper arm Pre-procedure details:    Skin preparation:  Betadine Anesthesia (see MAR for exact dosages):    Anesthesia method:  Local  infiltration   Local anesthetic:  Lidocaine 2% WITH epi Procedure type:    Complexity:  Simple Procedure details:    Needle aspiration: no     Incision types:  Single with marsupialization   Incision depth:  Subcutaneous   Scalpel blade:  11   Drainage:  Bloody and purulent   Drainage amount:  Moderate   Wound treatment:  Drain placed   Packing materials:  1/4 in iodoform gauze Post-procedure details:    Patient tolerance of procedure:  Tolerated with difficulty Comments:     Pain with procedure   (including critical care time)  Medications Ordered in UC Medications - No data to display   Initial Impression / Assessment and Plan / UC Course  I have reviewed the triage vital signs and the nursing notes.  Pertinent labs & imaging results that were available during my care of the patient were reviewed by me and considered in my medical decision making (see chart for details).     Continue to apply warm compresses to the abscess area. Allow to drain. Try to keep bandage in place and especially try to keep the packing intact. Returning 2 days for wound check and packing removal. For any worsening, new symptoms or problems may return. Take the antibiotic as directed. He also have medicine for pain. You may take ibuprofen 600 mg every 6 hours while you are taking the other pain medicine.   Final Clinical Impressions(s) / UC Diagnoses   Final diagnoses:  Abscess of axilla, right    New Prescriptions New Prescriptions   HYDROCODONE-ACETAMINOPHEN (NORCO/VICODIN) 5-325 MG TABLET    Take 1 tablet by mouth every 4 (four) hours as needed.   SULFAMETHOXAZOLE-TRIMETHOPRIM (BACTRIM DS,SEPTRA DS) 800-160 MG TABLET    Take 1 tablet by mouth 2 (two) times daily.     Controlled Substance Prescriptions [place] Controlled Substance Registry consulted? No   Janne Napoleon, NP 09/05/16 1423

## 2016-09-05 NOTE — Discharge Instructions (Signed)
Continue to apply warm compresses to the abscess area. Allow to drain. Try to keep bandage in place and especially try to keep the packing intact. Returning 2 days for wound check and packing removal. For any worsening, new symptoms or problems may return. Take the antibiotic as directed. He also have medicine for pain. You may take ibuprofen 600 mg every 6 hours while you are taking the other pain medicine.

## 2016-09-05 NOTE — ED Triage Notes (Signed)
Here for an abscess on right axilla onset 1 week  Sts it drained/popped but it has resurfaced again  A&O x4... NAD... Ambulatory

## 2016-09-07 ENCOUNTER — Ambulatory Visit (HOSPITAL_COMMUNITY)
Admission: EM | Admit: 2016-09-07 | Discharge: 2016-09-07 | Disposition: A | Discharge status: Home / Self Care | Payer: Self-pay | Attending: Family Medicine | Admitting: Family Medicine

## 2016-09-07 ENCOUNTER — Encounter (HOSPITAL_COMMUNITY): Payer: Self-pay | Admitting: Emergency Medicine

## 2016-09-07 DIAGNOSIS — Z5189 Encounter for other specified aftercare: Secondary | ICD-10-CM

## 2016-09-07 DIAGNOSIS — Z9889 Other specified postprocedural states: Secondary | ICD-10-CM

## 2016-09-07 NOTE — ED Triage Notes (Signed)
Seen 8/18 for I/d of abscess.  Patient has returned for follow up and packing removal.  Reports she is feeling much better

## 2016-09-07 NOTE — ED Provider Notes (Signed)
[facility]    CSN: [id number] Arrival date & time: 09/07/16  1112     History   Chief Complaint Chief Complaint  Patient presents with  . Wound Check    HPI Emily Rose is a 31 y.o. female.   31 year old female comes in for wound recheck after I&D 2 days ago. She has been taking antibiotics as directed. Denies fever, chills, night sweats. Denies spreading erythema, increased warmth, increased pain. She has been redressing wound without a problem.      History reviewed. No pertinent past medical history.  There are no active problems to display for this patient.   History reviewed. No pertinent surgical history.  OB History    No data available       Home Medications    Prior to Admission medications   Medication Sig Start Date End Date Taking? Authorizing Provider  HYDROcodone-acetaminophen (NORCO/VICODIN) 5-325 MG tablet Take 1 tablet by mouth every 4 (four) hours as needed. 09/05/16   Janne Napoleon, NP  sulfamethoxazole-trimethoprim (BACTRIM DS,SEPTRA DS) 800-160 MG tablet Take 1 tablet by mouth 2 (two) times daily. 09/05/16 09/12/16  Janne Napoleon, NP    Family History No family history on file.  Social History Social History  Substance Use Topics  . Smoking status: Current Every Day Smoker  . Smokeless tobacco: Never Used  . Alcohol use Yes     Allergies   Iodine and Shellfish allergy   Review of Systems Review of Systems  Reason unable to perform ROS: See HPI as above.     Physical Exam Triage Vital Signs ED Triage Vitals [09/07/16 1217]  Enc Vitals Group     BP 105/74     Pulse Rate 74     Resp 16     Temp 98.8 F (37.1 C)     Temp Source Oral     SpO2 100 %     Weight      Height      Head Circumference      Peak Flow      Pain Score      Pain Loc      Pain Edu?      Excl. in [place]?    No data found.   Updated Vital Signs BP 105/74 (BP Location: Left Arm)   Pulse 74   Temp 98.8 F (37.1 C) (Oral)   Resp 16    LMP 08/19/2016   SpO2 100%       Physical Exam  Constitutional: She is oriented to person, place, and time. She appears well-developed and well-nourished. No distress.  Neurological: She is alert and oriented to person, place, and time.  Skin:  0.5cm incision site on left axilla, visible packing material. Two palpable cyst directly inferior to incision site. No pain on palpation of cysts, no increased warmth, surrounding erythema.      UC Treatments / Results  Labs (all labs ordered are listed, but only abnormal results are displayed) Labs Reviewed - No data to display  EKG  EKG Interpretation None       Radiology No results found.  Procedures Procedures (including critical care time)  Medications Ordered in UC Medications - No data to display   Initial Impression / Assessment and Plan / UC Course  I have reviewed the triage vital signs and the nursing notes.  Pertinent labs & imaging results that were available during my care of the patient were reviewed by me and considered in my medical decision  making (see chart for details).    No visible purulent drainage within incision site. Wound clean and dry. Removed packing material, wound redressed. Patient to finish antibiotics as directed. Patient states 2 additional palpable cyst has been present, without pain, swelling, increased warmth. Patient to follow up with PCP for further evaluation of axilla cyst. Return precautions given.   Final Clinical Impressions(s) / UC Diagnoses   Final diagnoses:  Wound check, abscess    New Prescriptions Discharge Medication List as of 09/07/2016 12:47 PM         Ok Edwards, PA-C 09/07/16 1254

## 2016-09-07 NOTE — Discharge Instructions (Signed)
No additional purulent discharge on wound recheck. Finish antibiotics as directed. Monitor for any worsening of symptoms, increased pain, spreading redness, increased warmth, fever, follow-up for reevaluation.

## 2017-04-30 ENCOUNTER — Encounter (HOSPITAL_COMMUNITY): Payer: Self-pay | Admitting: Family Medicine

## 2017-04-30 ENCOUNTER — Ambulatory Visit (HOSPITAL_COMMUNITY)
Admission: EM | Admit: 2017-04-30 | Discharge: 2017-04-30 | Disposition: A | Discharge status: Home / Self Care | Payer: Self-pay | Attending: Family Medicine | Admitting: Family Medicine

## 2017-04-30 DIAGNOSIS — N898 Other specified noninflammatory disorders of vagina: Secondary | ICD-10-CM | POA: Insufficient documentation

## 2017-04-30 DIAGNOSIS — Z888 Allergy status to other drugs, medicaments and biological substances status: Secondary | ICD-10-CM | POA: Insufficient documentation

## 2017-04-30 DIAGNOSIS — F17200 Nicotine dependence, unspecified, uncomplicated: Secondary | ICD-10-CM | POA: Insufficient documentation

## 2017-04-30 DIAGNOSIS — Z91013 Allergy to seafood: Secondary | ICD-10-CM | POA: Insufficient documentation

## 2017-04-30 DIAGNOSIS — R109 Unspecified abdominal pain: Secondary | ICD-10-CM | POA: Insufficient documentation

## 2017-04-30 MED ORDER — FLUCONAZOLE 150 MG PO TABS: 150.0000 mg | ORAL_TABLET | Freq: Every day | ORAL | 0 refills | Status: DC

## 2017-04-30 NOTE — ED Triage Notes (Signed)
Pt here for lower abd pain. She has been having vaginal discharge with fishy odor. She had unprotected sex 2 weeks ago. Denies any urinary complaints. LMP was April 1st.

## 2017-04-30 NOTE — ED Provider Notes (Signed)
[place]    CSN: [id number] Arrival date & time: 04/30/17  1104     History   Chief Complaint Chief Complaint  Patient presents with  . Abdominal Pain    HPI Johana Hopkinson is a 32 y.o. female.   32 yo female here for abdominal pain x couple days. She said about 2 weeks ago after her period she had white vaginal discharge with odor. She thinks it is a yeast infection. She had unprotected sex prior to her period. She now has some mild abdominal pain that is not really bothersome but she is most concerned about STD exposure. She also now denies vaginal discharge or odor. Denies fever, chills.      History reviewed. No pertinent past medical history.  There are no active problems to display for this patient.   History reviewed. No pertinent surgical history.  OB History   None      Home Medications    Prior to Admission medications   Not on File    Family History History reviewed. No pertinent family history.  Social History Social History   Tobacco Use  . Smoking status: Current Every Day Smoker  . Smokeless tobacco: Never Used  Substance Use Topics  . Alcohol use: Yes  . Drug use: No     Allergies   Iodine and Shellfish allergy   Review of Systems Review of Systems  Constitutional: Negative for activity change and appetite change.  HENT: Negative for congestion and ear pain.   Eyes: Negative for discharge and itching.  Respiratory: Negative for apnea and chest tightness.   Cardiovascular: Negative for chest pain and palpitations.  Gastrointestinal: Positive for abdominal pain.  Endocrine: Negative for cold intolerance and heat intolerance.  Genitourinary: Positive for vaginal discharge. Negative for difficulty urinating and dysuria.  Neurological: Negative for dizziness and headaches.  Hematological: Negative for adenopathy. Does not bruise/bleed easily.     Physical Exam Triage Vital Signs ED Triage Vitals  Enc Vitals Group      BP 04/30/17 1216 136/89     Pulse Rate 04/30/17 1216 65     Resp 04/30/17 1216 18     Temp 04/30/17 1216 98.2 F (36.8 C)     Temp src --      SpO2 04/30/17 1216 98 %     Weight --      Height --      Head Circumference --      Peak Flow --      Pain Score 04/30/17 1217 4     Pain Loc --      Pain Edu? --      Excl. in [place]? --    No data found.  Updated Vital Signs BP 136/89   Pulse 65   Temp 98.2 F (36.8 C)   Resp 18   LMP 04/19/2017   SpO2 98%   Visual Acuity Right Eye Distance:   Left Eye Distance:   Bilateral Distance:    Right Eye Near:   Left Eye Near:    Bilateral Near:     Physical Exam  Constitutional: She is oriented to person, place, and time. She appears well-developed and well-nourished.  HENT:  Head: Normocephalic and atraumatic.  Eyes: Pupils are equal, round, and reactive to light. EOM are normal.  Pulmonary/Chest: Effort normal. No respiratory distress.  Abdominal: Soft. There is no tenderness.  Neurological: She is alert and oriented to person, place, and time.  Skin: Skin is warm and  dry.  Psychiatric: She has a normal mood and affect. Her behavior is normal.     UC Treatments / Results  Labs (all labs ordered are listed, but only abnormal results are displayed) Labs Reviewed - No data to display  EKG None Radiology No results found.  Procedures Procedures (including critical care time)  Medications Ordered in UC Medications - No data to display   Initial Impression / Assessment and Plan / UC Course  I have reviewed the triage vital signs and the nursing notes.  Pertinent labs & imaging results that were available during my care of the patient were reviewed by me and considered in my medical decision making (see chart for details).     1. Vaginal discharge: patient thinks discharge is yeast so will treat for this. Wet prep pending and will call if different results.  Final Clinical Impressions(s) / UC Diagnoses    Final diagnoses:  None    ED Discharge Orders    None       Controlled Substance Prescriptions  Controlled Substance Registry consulted? Not Applicable   Dannielle Huh, DO 04/30/17 1259

## 2017-04-30 NOTE — ED Notes (Signed)
Dirty urine collected and in lab

## 2017-05-03 ENCOUNTER — Telehealth (HOSPITAL_COMMUNITY): Payer: Self-pay

## 2017-05-03 LAB — URINE CYTOLOGY ANCILLARY ONLY
Chlamydia: NEGATIVE
Neisseria Gonorrhea: NEGATIVE
Trichomonas: POSITIVE — AB

## 2017-05-03 MED ORDER — METRONIDAZOLE 500 MG PO TABS: 500.0000 mg | ORAL_TABLET | Freq: Two times a day (BID) | ORAL | 0 refills | Status: DC

## 2017-05-03 NOTE — Telephone Encounter (Signed)
Trichomonas is positive. Rx metronidazole 500mg bid x 7d #14 no refills was sent to the pharmacy of record. PT called and made aware.  Educated patient to refrain from sexual intercourse for 7 days to give the medicine time to work. Sexual partners need to be notified and tested/treated. Condoms may reduce risk of reinfection.  Recheck for further evaluation if symptoms are not improving. Pt verbalized understanding.   

## 2017-05-04 LAB — URINE CYTOLOGY ANCILLARY ONLY: Candida vaginitis: NEGATIVE

## 2017-06-25 ENCOUNTER — Ambulatory Visit (HOSPITAL_COMMUNITY)
Admission: EM | Admit: 2017-06-25 | Discharge: 2017-06-25 | Disposition: A | Discharge status: Home / Self Care | Payer: Self-pay | Attending: Family Medicine | Admitting: Family Medicine

## 2017-06-25 ENCOUNTER — Encounter (HOSPITAL_COMMUNITY): Payer: Self-pay | Admitting: Family Medicine

## 2017-06-25 DIAGNOSIS — B9689 Other specified bacterial agents as the cause of diseases classified elsewhere: Secondary | ICD-10-CM

## 2017-06-25 DIAGNOSIS — A599 Trichomoniasis, unspecified: Secondary | ICD-10-CM | POA: Insufficient documentation

## 2017-06-25 DIAGNOSIS — N760 Acute vaginitis: Secondary | ICD-10-CM | POA: Insufficient documentation

## 2017-06-25 LAB — POCT URINALYSIS DIP (DEVICE)
Bilirubin Urine: NEGATIVE
Glucose, UA: NEGATIVE mg/dL
Ketones, ur: NEGATIVE mg/dL
Nitrite: NEGATIVE
PH: 7 (ref 5.0–8.0)
Protein, ur: NEGATIVE mg/dL
SPECIFIC GRAVITY, URINE: 1.02 (ref 1.005–1.030)
Urobilinogen, UA: 0.2 mg/dL (ref 0.0–1.0)

## 2017-06-25 LAB — POCT PREGNANCY, URINE: Preg Test, Ur: NEGATIVE

## 2017-06-25 MED ORDER — METRONIDAZOLE 500 MG PO TABS: 500.0000 mg | ORAL_TABLET | Freq: Two times a day (BID) | ORAL | 0 refills | Status: DC

## 2017-06-25 NOTE — ED Triage Notes (Addendum)
Pt here for vaginal dischrge,. She treted with OTC monistat and it made her very swollen. This am she woke up with heavy vaginal bleeding. She just had her period the last week in may. Pt was prescribed meds last month for infection and didn't take the meds.

## 2017-06-25 NOTE — ED Provider Notes (Signed)
[place]    CSN: [id number] Arrival date & time: 06/25/17  1313     History   Chief Complaint Chief Complaint  Patient presents with  . Vaginal Discharge  . Vaginal Bleeding    HPI Emily Rose is a 32 y.o. female.   HPI  Patient was seen here in May for vaginal infection.  She was identified as having Gardnerella vaginosis and trichomonas.  She was treated with metronidazole.  She was also given Diflucan.  Because of some misunderstanding she never got the prescriptions filled.  She did not take the medication.  She is here with continued symptoms.  She has vaginal itching and discharge.  Some malodorous changes.  No abdominal pain, no fever.  She is having some spotting that is intermenstrual.  She does not think that she is pregnant.  She denies new sexual partners.  History reviewed. No pertinent past medical history.  There are no active problems to display for this patient.   History reviewed. No pertinent surgical history.  OB History   None      Home Medications    Prior to Admission medications   Medication Sig Start Date End Date Taking? Authorizing Provider  metroNIDAZOLE (FLAGYL) 500 MG tablet Take 1 tablet (500 mg total) by mouth 2 (two) times daily. 06/25/17   Raylene Everts, MD    Family History History reviewed. No pertinent family history.  Social History Social History   Tobacco Use  . Smoking status: Current Every Day Smoker  . Smokeless tobacco: Never Used  Substance Use Topics  . Alcohol use: Yes  . Drug use: No     Allergies   Iodine and Shellfish allergy   Review of Systems Review of Systems  Constitutional: Negative for chills and fever.  HENT: Negative for ear pain and sore throat.   Eyes: Negative for pain and visual disturbance.  Respiratory: Negative for cough and shortness of breath.   Cardiovascular: Negative for chest pain and palpitations.  Gastrointestinal: Negative for abdominal pain and vomiting.    Genitourinary: Positive for hematuria, menstrual problem and vaginal discharge. Negative for dysuria and frequency.  Musculoskeletal: Negative for arthralgias and back pain.  Skin: Negative for color change and rash.  Neurological: Negative for seizures and syncope.  All other systems reviewed and are negative.    Physical Exam Triage Vital Signs ED Triage Vitals [06/25/17 1340]  Enc Vitals Group     BP      Pulse      Resp      Temp      Temp src      SpO2      Weight      Height      Head Circumference      Peak Flow      Pain Score 0     Pain Loc      Pain Edu?      Excl. in [place]?    No data found.  Updated Vital Signs LMP 06/15/2017   Visual Acuity Right Eye Distance:   Left Eye Distance:   Bilateral Distance:    Right Eye Near:   Left Eye Near:    Bilateral Near:     Physical Exam  Constitutional: She appears well-developed and well-nourished. No distress.  HENT:  Head: Normocephalic and atraumatic.  Mouth/Throat: Oropharynx is clear and moist.  Eyes: Pupils are equal, round, and reactive to light. Conjunctivae are normal.  Neck: Normal range of motion.  Cardiovascular: Normal rate.  Pulmonary/Chest: Effort normal. No respiratory distress.  Abdominal: Soft. She exhibits no distension.  Musculoskeletal: Normal range of motion. She exhibits no edema.  Neurological: She is alert.  Skin: Skin is warm and dry.     UC Treatments / Results  Labs (all labs ordered are listed, but only abnormal results are displayed) Labs Reviewed  POCT URINALYSIS DIP (DEVICE) - Abnormal; Notable for the following components:      Result Value   Hgb urine dipstick SMALL (*)    Leukocytes, UA LARGE (*)    All other components within normal limits  URINE CULTURE  RPR  HIV ANTIBODY (ROUTINE TESTING)  POCT PREGNANCY, URINE  URINE CYTOLOGY ANCILLARY ONLY    EKG None  Radiology No results found.  Procedures Procedures (including critical care time)  Medications  Ordered in UC Medications - No data to display  Initial Impression / Assessment and Plan / UC Course  I have reviewed the triage vital signs and the nursing notes.  Pertinent labs & imaging results that were available during my care of the patient were reviewed by me and considered in my medical decision making (see chart for details).     Discussed safe sex.  Discussed prevention of STDs.  Discussed the importance of taking all of the antibiotics prescribed for her for these infections.  Because of the presence of STD on her last evaluation I recommend a full testing including HIV RPR GC and chlamydia.  Patient agrees. Final Clinical Impressions(s) / UC Diagnoses   Final diagnoses:  BV (bacterial vaginosis)  Trichomonal infection     Discharge Instructions     Take the metronidazole for 7 days Avoid alcohol No intercourse until 7 days after treatment We did lab testing during this visit.  If there are any abnormal findings that require change in medicine or indicate a positive result, you will be notified.  If all of your tests are normal, you will not be called.      ED Prescriptions    Medication Sig Dispense Auth. Provider   metroNIDAZOLE (FLAGYL) 500 MG tablet Take 1 tablet (500 mg total) by mouth 2 (two) times daily. 14 tablet Raylene Everts, MD     Controlled Substance Prescriptions [place] Controlled Substance Registry consulted? Not Applicable   Raylene Everts, MD 06/25/17 [phone]

## 2017-06-25 NOTE — Discharge Instructions (Signed)
Take the metronidazole for 7 days Avoid alcohol No intercourse until 7 days after treatment We did lab testing during this visit.  If there are any abnormal findings that require change in medicine or indicate a positive result, you will be notified.  If all of your tests are normal, you will not be called.

## 2017-06-26 LAB — HIV ANTIBODY (ROUTINE TESTING W REFLEX): HIV SCREEN 4TH GENERATION: NONREACTIVE

## 2017-06-26 LAB — RPR: RPR Ser Ql: NONREACTIVE

## 2017-06-27 LAB — URINE CULTURE

## 2017-06-28 ENCOUNTER — Telehealth (HOSPITAL_COMMUNITY): Payer: Self-pay

## 2017-06-28 LAB — URINE CYTOLOGY ANCILLARY ONLY
Chlamydia: POSITIVE — AB
Neisseria Gonorrhea: POSITIVE — AB
TRICH (WINDOWPATH): POSITIVE — AB

## 2017-06-28 MED ORDER — AZITHROMYCIN 250 MG PO TABS: 1000.0000 mg | ORAL_TABLET | Freq: Once | ORAL | 0 refills | Status: AC

## 2017-06-28 NOTE — Telephone Encounter (Signed)
Chlamydia is positive.  Rx po zithromax 1g #1 dose no refills was sent to the pharmacy of record.  Pt contacted and made aware, educated to please refrain from sexual intercourse for 7 days to give the medicine time to work, sexual partners need to be notified and tested/treated.  Condoms may reduce risk of reinfection.  Recheck or followup with PCP for further evaluation if symptoms are not improving.   [facility] notified  Gonorrhea is positive.  Patient should return as soon as possible to the urgent care for treatment with IM rocephin 250mg  and po zithromax 1g. Patient will not need to see a provider unless there are new symptoms she would like evaluated. Pt called and made aware, educated patient to refrain from sexual intercourse for now and for 7 days after treatment to give the medicine time to work. Sexual partners need to be notified and tested/treated. Condoms may reduce risk of reinfection. Answered all patient questions. [facility] notified.   Trichomonas is positive. Rx metronidazole was given at the urgent care visit. Pt contacted and made aware, educated to please refrain from sexual intercourse for 7 days to give the medicine time to work. Sexual partners need to be notified and tested/treated. Condoms may reduce risk of reinfection. Recheck for further evaluation if symptoms are not improving. Answered all questions.

## 2017-06-29 ENCOUNTER — Encounter (HOSPITAL_COMMUNITY): Payer: Self-pay | Admitting: Urgent Care

## 2017-06-29 ENCOUNTER — Ambulatory Visit (HOSPITAL_COMMUNITY)
Admission: EM | Admit: 2017-06-29 | Discharge: 2017-06-29 | Disposition: A | Discharge status: Home / Self Care | Payer: Self-pay | Attending: Family Medicine | Admitting: Family Medicine

## 2017-06-29 DIAGNOSIS — A549 Gonococcal infection, unspecified: Secondary | ICD-10-CM

## 2017-06-29 MED ORDER — CEFTRIAXONE SODIUM 250 MG IJ SOLR
INTRAMUSCULAR | Status: AC
  Filled 2017-06-29: qty 250

## 2017-06-29 MED ORDER — CEFTRIAXONE SODIUM 250 MG IJ SOLR
250.0000 mg | Freq: Once | INTRAMUSCULAR | Status: AC
  Administered 2017-06-29: 250 mg via INTRAMUSCULAR

## 2017-06-29 NOTE — ED Triage Notes (Signed)
Pt here for 250mg  injection Rocephin per MD order and phone triage note.

## 2017-07-02 ENCOUNTER — Telehealth (HOSPITAL_COMMUNITY): Payer: Self-pay

## 2017-07-02 LAB — URINE CYTOLOGY ANCILLARY ONLY: CANDIDA VAGINITIS: NEGATIVE

## 2017-07-02 NOTE — Telephone Encounter (Signed)
Bacterial Vaginosis test is positive.  Prescription for metronidazole was given at the urgent care visit. Pt contacted regarding results. Answered all questions. Verbalized understanding.   

## 2019-03-14 ENCOUNTER — Other Ambulatory Visit: Payer: Self-pay

## 2019-03-14 ENCOUNTER — Emergency Department (HOSPITAL_COMMUNITY)
Admission: EM | Admit: 2019-03-14 | Discharge: 2019-03-14 | Disposition: A | Discharge status: Home / Self Care | Payer: Self-pay | Attending: Emergency Medicine | Admitting: Emergency Medicine

## 2019-03-14 DIAGNOSIS — J020 Streptococcal pharyngitis: Secondary | ICD-10-CM | POA: Insufficient documentation

## 2019-03-14 DIAGNOSIS — F17200 Nicotine dependence, unspecified, uncomplicated: Secondary | ICD-10-CM | POA: Insufficient documentation

## 2019-03-14 LAB — GROUP A STREP BY PCR: Group A Strep by PCR: DETECTED — AB

## 2019-03-14 LAB — POC URINE PREG, ED: Preg Test, Ur: NEGATIVE

## 2019-03-14 MED ORDER — PENICILLIN G BENZATHINE & PROC 1200000 UNIT/2ML IM SUSP
1.2000 10*6.[IU] | Freq: Once | INTRAMUSCULAR | Status: AC
  Administered 2019-03-14: 11:00:00 1.2 10*6.[IU] via INTRAMUSCULAR
  Filled 2019-03-14: qty 2

## 2019-03-14 MED ORDER — LIDOCAINE VISCOUS HCL 2 % MT SOLN
15.0000 mL | Freq: Once | OROMUCOSAL | Status: AC
Start: 2019-03-14 — End: 2019-03-14
  Administered 2019-03-14: 10:00:00 15 mL via OROMUCOSAL
  Filled 2019-03-14: qty 15

## 2019-03-14 MED ORDER — DEXAMETHASONE SODIUM PHOSPHATE 10 MG/ML IJ SOLN
10.0000 mg | Freq: Once | INTRAMUSCULAR | Status: AC
  Administered 2019-03-14: 11:00:00 10 mg via INTRAMUSCULAR
  Filled 2019-03-14: qty 1

## 2019-03-14 MED ORDER — DEXAMETHASONE 1 MG/ML PO CONC
10.0000 mg | Freq: Once | ORAL | Status: DC
Start: 2019-03-14 — End: 2019-03-14
  Filled 2019-03-14: qty 10

## 2019-03-14 MED ORDER — ACETAMINOPHEN 325 MG PO TABS
650.0000 mg | ORAL_TABLET | Freq: Once | ORAL | Status: AC
  Administered 2019-03-14: 650 mg via ORAL
  Filled 2019-03-14: qty 2

## 2019-03-14 NOTE — ED Provider Notes (Signed)
[facility] EMERGENCY DEPARTMENT Provider Note   CSN: [id number] Arrival date & time: 03/14/19  [id number]     History Chief Complaint  Patient presents with  . Sore Throat    Emily Rose is a 34 y.o. female with no known past medical history presents to emergency department today with chief complaint of sore throat x2 day.  She states she slept with her windows openand when she woke up  The next morning her throat was feeling sore.  She describes sore throat as an aching sensation that is worse when swallowing or talking.  She tried using an over-the-counter throat spray with minimal symptom relief.  She has a history of strep throat and states this feels similar. She denies fever, chills, congestion, cough, headache, neck pain, shortness of breath, chest pain, abdominal pain, nausea, vomiting, urinary symptoms, diarrhea, rash.  Denies loss of taste or smell, denies any sick contacts or Covid exposures. History provided by patient with additional history obtained from chart review.     No past medical history on file.  There are no problems to display for this patient.   No past surgical history on file.   OB History   No obstetric history on file.     No family history on file.  Social History   Tobacco Use  . Smoking status: Current Every Day Smoker  . Smokeless tobacco: Never Used  Substance Use Topics  . Alcohol use: Yes  . Drug use: No    Home Medications Prior to Admission medications   Medication Sig Start Date End Date Taking? Authorizing Provider  metroNIDAZOLE (FLAGYL) 500 MG tablet Take 1 tablet (500 mg total) by mouth 2 (two) times daily. 06/25/17   Raylene Everts, MD    Allergies    Iodine and Shellfish allergy  Review of Systems   Review of Systems All other systems are reviewed and are negative for acute change except as noted in the HPI.  Physical Exam Updated Vital Signs BP (!) 156/99 (BP Location: Right Arm)   Pulse 74    Temp 98.4 F (36.9 C) (Oral)   Resp 15   Ht 5\' 2"  (1.575 m)   Wt 57.6 kg   LMP 02/16/2019 (Approximate)   SpO2 95%   BMI 23.23 kg/m   Physical Exam Vitals and nursing note reviewed.  Constitutional:      Appearance: She is well-developed. She is not ill-appearing or toxic-appearing.  HENT:     Head: Normocephalic and atraumatic.     Nose: Nose normal.     Mouth/Throat:     Comments: Minor erythema to oropharynx, no edema, no exudate, with bilateral 1+ tonsillar swelling, voice normal, neck supple without lymphadenopathy  Eyes:     General: No scleral icterus.       Right eye: No discharge.        Left eye: No discharge.     Conjunctiva/sclera: Conjunctivae normal.  Neck:     Vascular: No JVD.  Cardiovascular:     Rate and Rhythm: Normal rate and regular rhythm.     Pulses: Normal pulses.     Heart sounds: Normal heart sounds.  Pulmonary:     Effort: Pulmonary effort is normal.     Breath sounds: Normal breath sounds.  Abdominal:     General: There is no distension.  Musculoskeletal:        General: Normal range of motion.     Cervical back: Normal range of motion.  Skin:  General: Skin is warm and dry.     Findings: No rash.  Neurological:     Mental Status: She is oriented to person, place, and time.     GCS: GCS eye subscore is 4. GCS verbal subscore is 5. GCS motor subscore is 6.     Comments: Fluent speech, no facial droop.  Psychiatric:        Behavior: Behavior normal.       ED Results / Procedures / Treatments   Labs (all labs ordered are listed, but only abnormal results are displayed) Labs Reviewed  GROUP A STREP BY PCR - Abnormal; Notable for the following components:      Result Value   Group A Strep by PCR DETECTED (*)    All other components within normal limits  POC URINE PREG, ED    EKG None  Radiology No results found.  Procedures Procedures (including critical care time)  Medications Ordered in ED Medications  dexamethasone  (DECADRON) 1 MG/ML solution 10 mg (has no administration in time range)  penicillin g procaine-penicillin g benzathine (BICILLIN-CR) injection 600000-600000 units (has no administration in time range)  lidocaine (XYLOCAINE) 2 % viscous mouth solution 15 mL (15 mLs Mouth/Throat Given 03/14/19 1001)  acetaminophen (TYLENOL) tablet 650 mg (650 mg Oral Given 03/14/19 1001)    ED Course  I have reviewed the triage vital signs and the nursing notes.  Pertinent labs & imaging results that were available during my care of the patient were reviewed by me and considered in my medical decision making (see chart for details).    MDM Rules/Calculators/A&P                      34 yo female who presents with sore throat. Still able to tolerate PO/secretions but with worsening pain. Patient is afebrile, non-toxic appearing, sitting comfortably on examination table. Vital signs reviewed and stable. On exam, she has moderate erythema to the oropharynx and bilateral tonsillar swelling without exudate. Presentation not concerning for PTA or Ludwig's angina, Uvulitis, epiglottitis, peritonsillar abscess, or retropharyngeal abscess. Centor score of 3, strep test ordered. Urine pregnancy test is negative.  Strep reviewed. Positive. Pt treated with IM penicillin, steroids while in the ED. Pt does not appear dehydrated, but did discuss importance of water rehydration.  Encouraged at home supportive care measures. Patient successfully fluid challenged in the ED without difficulty swallowing.  Strict return precautions given. NAD. VSS. Recommended PCP follow up for re-evaluation.    Portions of this note were generated with [employer]. Dictation errors may occur despite best attempts at proofreading.   Final Clinical Impression(s) / ED Diagnoses Final diagnoses:  Strep pharyngitis    Rx / DC Orders ED Discharge Orders    None       Cherre Robins, PA-C 03/14/19 1017    Little, Wenda Overland, MD 03/14/19 1245

## 2019-03-14 NOTE — ED Triage Notes (Signed)
Pt to ED c/o sore throat that started yesterday, reports she slept with the window open and that's when her symptoms started. Pt reports she thinks she has strep throat, pt denies cough, denies fever- denies any known COVID exposure.

## 2019-03-14 NOTE — Discharge Instructions (Addendum)
You were seen in the emergency department and diagnosed with strep throat.  You were given a shot of Penicillin and  Decadron.  - Penicillin is an antibiotic used to treat the infection - Decadron is a steroid used to treat the pain and swelling of your throat.   You should gradually feel better over the next few days. Take Tylenol and Ibuprofen for fever and pain. Follow up with your primary care provider in the next 1 week if you are not feeling better, if you do not have a primary care provider one is provided in your discharge instructions. Return to the emergency department for any new or worsening symptoms including but not limited to inability to open your mouth, inability to move your neck, worsening pain, change in your voice, inability to swallow your own saliva, drooling, or any other concerns.

## 2019-03-14 NOTE — ED Notes (Signed)
Pt dc home per MD order. Discharge summary reviewed, pt verbalizes understanding. No s/s of acute distress noted. Pt ambulatory off unit. Reports discharge ride home

## 2019-03-14 NOTE — ED Notes (Signed)
Pts urine at bedside if needed for more tests.

## 2021-03-11 ENCOUNTER — Other Ambulatory Visit: Payer: Self-pay

## 2021-03-11 ENCOUNTER — Encounter (HOSPITAL_COMMUNITY): Payer: Self-pay | Admitting: Emergency Medicine

## 2021-03-11 ENCOUNTER — Emergency Department (HOSPITAL_COMMUNITY)
Admission: EM | Admit: 2021-03-11 | Discharge: 2021-03-12 | Disposition: A | Discharge status: Home / Self Care | Payer: Self-pay | Attending: Emergency Medicine | Admitting: Emergency Medicine

## 2021-03-11 DIAGNOSIS — D259 Leiomyoma of uterus, unspecified: Secondary | ICD-10-CM | POA: Insufficient documentation

## 2021-03-11 DIAGNOSIS — N920 Excessive and frequent menstruation with regular cycle: Secondary | ICD-10-CM | POA: Insufficient documentation

## 2021-03-11 DIAGNOSIS — D219 Benign neoplasm of connective and other soft tissue, unspecified: Secondary | ICD-10-CM

## 2021-03-11 LAB — CBC WITH DIFFERENTIAL/PLATELET
Abs Immature Granulocytes: 0 10*3/uL (ref 0.00–0.07)
Basophils Absolute: 0.1 10*3/uL (ref 0.0–0.1)
Basophils Relative: 1 %
Eosinophils Absolute: 0.3 10*3/uL (ref 0.0–0.5)
Eosinophils Relative: 5 %
HCT: 35.3 % — ABNORMAL LOW (ref 36.0–46.0)
Hemoglobin: 11.1 g/dL — ABNORMAL LOW (ref 12.0–15.0)
Immature Granulocytes: 0 %
Lymphocytes Relative: 39 %
Lymphs Abs: 2.1 10*3/uL (ref 0.7–4.0)
MCH: 29.6 pg (ref 26.0–34.0)
MCHC: 31.4 g/dL (ref 30.0–36.0)
MCV: 94.1 fL (ref 80.0–100.0)
Monocytes Absolute: 0.4 10*3/uL (ref 0.1–1.0)
Monocytes Relative: 8 %
Neutro Abs: 2.5 10*3/uL (ref 1.7–7.7)
Neutrophils Relative %: 47 %
Platelets: 344 10*3/uL (ref 150–400)
RBC: 3.75 MIL/uL — ABNORMAL LOW (ref 3.87–5.11)
RDW: 14.5 % (ref 11.5–15.5)
WBC: 5.3 10*3/uL (ref 4.0–10.5)
nRBC: 0 % (ref 0.0–0.2)

## 2021-03-11 LAB — URINALYSIS, ROUTINE W REFLEX MICROSCOPIC
Bilirubin Urine: NEGATIVE
Glucose, UA: NEGATIVE mg/dL
Ketones, ur: NEGATIVE mg/dL
Leukocytes,Ua: NEGATIVE
Nitrite: NEGATIVE
Protein, ur: NEGATIVE mg/dL
RBC / HPF: >50 RBC/hpf — ABNORMAL HIGH (ref 0–5)
Specific Gravity, Urine: 1.008 (ref 1.005–1.030)
pH: 5 (ref 5.0–8.0)

## 2021-03-11 LAB — COMPREHENSIVE METABOLIC PANEL
ALT: 11 U/L (ref 0–44)
AST: 18 U/L (ref 15–41)
Albumin: 3.6 g/dL (ref 3.5–5.0)
Alkaline Phosphatase: 45 U/L (ref 38–126)
Anion gap: 8 (ref 5–15)
BUN: 6 mg/dL (ref 6–20)
CO2: 27 mmol/L (ref 22–32)
Calcium: 9.1 mg/dL (ref 8.9–10.3)
Chloride: 104 mmol/L (ref 98–111)
Creatinine, Ser: 0.88 mg/dL (ref 0.44–1.00)
GFR, Estimated: >60 mL/min (ref >60)
Glucose, Bld: 95 mg/dL (ref 70–99)
Potassium: 3.5 mmol/L (ref 3.5–5.1)
Sodium: 139 mmol/L (ref 135–145)
Total Bilirubin: 0.3 mg/dL (ref 0.3–1.2)
Total Protein: 6.9 g/dL (ref 6.5–8.1)

## 2021-03-11 LAB — LIPASE, BLOOD: Lipase: 28 U/L (ref 11–51)

## 2021-03-11 LAB — I-STAT BETA HCG BLOOD, ED (MC, WL, AP ONLY): I-stat hCG, quantitative: <5 m[IU]/mL (ref <5)

## 2021-03-11 NOTE — ED Triage Notes (Addendum)
Patient reports heavy menstrual bleeding with clots and low abdominal cramping / fatigue this week , diagnosed with fibroids by her OB/Gyne 2 weeks ago .

## 2021-03-11 NOTE — ED Provider Notes (Signed)
[facility] EMERGENCY DEPARTMENT Provider Note   CSN: [id number] Arrival date & time: 03/11/21  2051     History  Chief Complaint  Patient presents with   Heavy Menstrual Bleeding / Fatigue    Emily Rose is a 36 y.o. female who presents emergency department with chief complaint of vaginal bleeding.  Patient states that she saw her OB/GYN 2 weeks ago and was told that she likely has fibroids.  She states that she has been passing heavy clots and bleeding and also complains of pain in the left lower quadrant of her pelvis.  She did not receive an ultrasound at that time.  She states that her pain is moderate to severe at times.  She has been feeling lightheaded when she stands up and came in for further evaluation.  Patient offered pelvic exam but declines at this time since she had 1 2 weeks ago.  She denies nausea, vomiting, fevers, urinary symptoms or, diarrhea or constipation.  HPI     Home Medications Prior to Admission medications   Medication Sig Start Date End Date Taking? Authorizing Provider  Acetaminophen (MIDOL PO) Take 2 tablets by mouth every 6 (six) hours as needed (menstural cramping/pain).   Yes [provider]  acetaminophen (TYLENOL) 500 MG tablet Take 1,000 mg by mouth every 6 (six) hours as needed for moderate pain.   Yes [provider]  ferrous sulfate 325 (65 FE) MG tablet Take 325 mg by mouth daily. 02/14/21  Yes [provider]  megestrol (MEGACE) 40 MG tablet 3 tabs a day for 5 days(all at once) 2 tabs a day for 5 days(all at once) then 1 tablet a day 03/12/21  Yes Lamari Beckles, Vernie Shanks, PA-C      Allergies    Iodine and Shellfish allergy    Review of Systems   Review of Systems  Physical Exam Updated Vital Signs BP 126/82    Pulse 82    Temp 98.4 F (36.9 C) (Oral)    Resp 16    Ht 5\' 2"  (1.575 m)    Wt 62 kg    LMP 03/09/2021    SpO2 98%    BMI 25.00 kg/m  Physical Exam Vitals and nursing note reviewed.   Constitutional:      General: She is not in acute distress.    Appearance: She is well-developed. She is not diaphoretic.  HENT:     Head: Normocephalic and atraumatic.     Right Ear: External ear normal.     Left Ear: External ear normal.     Nose: Nose normal.     Mouth/Throat:     Mouth: Mucous membranes are moist.  Eyes:     General: No scleral icterus.    Conjunctiva/sclera: Conjunctivae normal.  Cardiovascular:     Rate and Rhythm: Normal rate and regular rhythm.     Heart sounds: Normal heart sounds. No murmur heard.   No friction rub. No gallop.  Pulmonary:     Effort: Pulmonary effort is normal. No respiratory distress.     Breath sounds: Normal breath sounds.  Abdominal:     General: Bowel sounds are normal. There is no distension.     Palpations: Abdomen is soft. There is no mass.     Tenderness: There is abdominal tenderness in the left lower quadrant. There is no guarding.  Musculoskeletal:     Cervical back: Normal range of motion.  Skin:    General: Skin is warm and dry.  Neurological:     Mental Status: She is alert and oriented to person, place, and time.  Psychiatric:        Behavior: Behavior normal.    ED Results / Procedures / Treatments   Labs (all labs ordered are listed, but only abnormal results are displayed) Labs Reviewed  CBC WITH DIFFERENTIAL/PLATELET - Abnormal; Notable for the following components:      Result Value   RBC 3.75 (*)    Hemoglobin 11.1 (*)    HCT 35.3 (*)    All other components within normal limits  URINALYSIS, ROUTINE W REFLEX MICROSCOPIC - Abnormal; Notable for the following components:   Hgb urine dipstick LARGE (*)    RBC / HPF >50 (*)    Bacteria, UA FEW (*)    All other components within normal limits  COMPREHENSIVE METABOLIC PANEL  LIPASE, BLOOD  I-STAT BETA HCG BLOOD, ED ([facility], [facility], [facility] ONLY)    EKG None  Radiology US Transvaginal Non-OB  Result Date: 03/12/2021 CLINICAL DATA:  Left lower quadrant pain and  vaginal bleeding. EXAM: TRANSABDOMINAL AND TRANSVAGINAL ULTRASOUND OF PELVIS DOPPLER ULTRASOUND OF OVARIES TECHNIQUE: Both transabdominal and transvaginal ultrasound examinations of the pelvis were performed. Transabdominal technique was performed for global imaging of the pelvis including uterus, ovaries, adnexal regions, and pelvic cul-de-sac. It was necessary to proceed with endovaginal exam following the transabdominal exam to visualize the uterus, endometrium, bilateral ovaries and bilateral adnexa. Color and duplex Doppler ultrasound was utilized to evaluate blood flow to the ovaries. COMPARISON:  None. FINDINGS: Uterus Measurements: 8.3 cm x 5.1 cm x 5.7 cm = volume: 123.96 mL. Multiple heterogeneous uterine fibroids are seen. The largest measures approximately 2.8 cm x 1.7 cm x 2.7 cm. Endometrium Thickness: 9.13 mm.  No focal abnormality visualized. Right ovary Measurements: 3.1 cm x 1.1 cm x 2.3 cm = volume: 4.22 mL. Normal appearance/no adnexal mass. Left ovary Measurements: 2.4 cm x 1.6 cm x 2.2 cm = volume: 4.38 mL. Normal appearance/no adnexal mass. Pulsed Doppler evaluation demonstrates normal low-resistance arterial and venous waveforms in both ovaries. Other: There is a trace amount of pelvic free fluid. IMPRESSION: 1. Multiple heterogeneous uterine fibroids. 2. Trace amount of pelvic free fluid which is likely physiologic in nature. Electronically Signed   By: Virgina Norfolk M.D.   On: 03/12/2021 01:15   US Pelvis Complete  Result Date: 03/12/2021 CLINICAL DATA:  Left lower quadrant pain and vaginal bleeding. EXAM: TRANSABDOMINAL ULTRASOUND OF PELVIS DOPPLER ULTRASOUND OF OVARIES TECHNIQUE: Transabdominal ultrasound examination of the pelvis was performed including evaluation of the uterus, ovaries, adnexal regions, and pelvic cul-de-sac. Color and duplex Doppler ultrasound was utilized to evaluate blood flow to the ovaries. COMPARISON:  None. FINDINGS: Uterus Measurements: 8.3 cm x 5.1 cm x  5.7 cm = volume: 123.96 mL. Multiple heterogeneous uterine fibroids are seen. The largest measures approximately 2.8 cm x 1.7 cm x 2.7 cm. Endometrium Thickness: 9.13 mm.  No focal abnormality visualized. Right ovary Measurements: 3.1 cm x 1.1 cm x 2.3 cm = volume: 4.22 mL. Normal appearance/no adnexal mass. Left ovary Measurements: 2.4 cm x 1.6 cm x 2.2 cm = volume: 4.38 mL. Normal appearance/no adnexal mass. Pulsed Doppler evaluation demonstrates normal low-resistance arterial and venous waveforms in both ovaries. Other: There is a trace amount of pelvic free fluid. IMPRESSION: 1. Multiple heterogeneous uterine fibroids. 2. Trace amount of pelvic free fluid which is likely physiologic in nature. Electronically Signed   By: Virgina Norfolk M.D.   On:  03/12/2021 01:13   [place] Art/Ven Flow Abd Pelv Doppler  Result Date: 03/12/2021 CLINICAL DATA:  Left lower quadrant pain and vaginal bleeding. EXAM: TRANSABDOMINAL AND TRANSVAGINAL ULTRASOUND OF PELVIS DOPPLER ULTRASOUND OF OVARIES TECHNIQUE: Both transabdominal and transvaginal ultrasound examinations of the pelvis were performed. Transabdominal technique was performed for global imaging of the pelvis including uterus, ovaries, adnexal regions, and pelvic cul-de-sac. It was necessary to proceed with endovaginal exam following the transabdominal exam to visualize the uterus, endometrium, bilateral ovaries and bilateral adnexa. Color and duplex Doppler ultrasound was utilized to evaluate blood flow to the ovaries. COMPARISON:  None. FINDINGS: Uterus Measurements: 8.3 cm x 5.1 cm x 5.7 cm = volume: 123.96 mL. Multiple heterogeneous uterine fibroids are seen. The largest measures approximately 2.8 cm x 1.7 cm x 2.7 cm. Endometrium Thickness: 9.13 mm.  No focal abnormality visualized. Right ovary Measurements: 3.1 cm x 1.1 cm x 2.3 cm = volume: 4.22 mL. Normal appearance/no adnexal mass. Left ovary Measurements: 2.4 cm x 1.6 cm x 2.2 cm = volume: 4.38 mL. Normal  appearance/no adnexal mass. Pulsed Doppler evaluation demonstrates normal low-resistance arterial and venous waveforms in both ovaries. Other: There is a trace amount of pelvic free fluid. IMPRESSION: 1. Multiple heterogeneous uterine fibroids. 2. Trace amount of pelvic free fluid which is likely physiologic in nature. Electronically Signed   By: Virgina Norfolk M.D.   On: 03/12/2021 01:13    Procedures Procedures    Medications Ordered in ED Medications - No data to display  ED Course/ Medical Decision Making/ A&P                           Medical Decision Making Patient here with non-pregnant vaginal bleeding and LLQ pain. Differential diagnosis for nonpregnant vaginal bleeding includes but is not limited to systemic causes such as cirrhosis of the liver, coagulopathy such as ITP or von Willebrand's disease, strep vaginitis, HRT, hypothyroidism, secondary anovulation.  Reproductive tract causes include adenomyosis, atrophic endometrium, dysfunctional uterine bleeding, endometriosis, fibroids, foreign body, infection, IUD, neoplasia or vaginal trauma. Patient declined pelvic examination. I reviewed the patient's labs.  Notable findings include and a hemoglobin of 11.1 which is only minimally anemic.  Her urine appears contaminated.  She has a negative pregnancy test and no other acute findings. Patient has negative orthostatic vital signs.  Will discharge with Megace and close outpatient follow-up.  Social determinants of health making discharge possible include an established OB/GYN relationship.  No evidence of hemorrhage.    Amount and/or Complexity of Data Reviewed Labs: ordered. Radiology: ordered and independent interpretation performed.    Details: I visualized the patient's pelvic ultrasound films, she has multiple fibroids and no evidence of torsion.  Agree with radiologic interpretation  Risk Prescription drug management.  Final Clinical Impression(s) / ED Diagnoses Final  diagnoses:  Menorrhagia with regular cycle  Fibroids    Rx / DC Orders ED Discharge Orders          Ordered    megestrol (MEGACE) 40 MG tablet        03/12/21 0121              Margarita Mail, PA-C 03/13/21 [place], [place], DO 03/13/21 1610

## 2021-03-11 NOTE — ED Provider Triage Note (Signed)
Emergency Medicine Provider Triage Evaluation Note  Emily Rose , a 36 y.o. female  was evaluated in triage.  Pt complains of menstrual bleeding.  Patient states that she started having her period about 3 days ago and has been very heavy.  She is having very large blood clots that are approximately 3 cm round.  She is also having some pelvic discomfort as well.  She states that today she started being dizzy and having generalized weakness.  It is not typical for her to have this type of menstrual cycle.  Review of Systems  Positive: Pelvic pain, vaginal bleeding Negative:   Physical Exam  BP (!) 121/91 (BP Location: Right Arm)    Pulse 66    Temp 98.7 F (37.1 C) (Oral)    Resp 18    Ht 5\' 2"  (1.575 m)    Wt 62 kg    LMP 03/09/2021    SpO2 99%    BMI 25.00 kg/m  Gen:   Awake, no distress   Resp:  Normal effort  MSK:   Moves extremities without difficulty  Other:  Suprapubic abdominal discomfort noted  Medical Decision Making  Medically screening exam initiated at 9:31 PM.  Appropriate orders placed.  Kerby Brix was informed that the remainder of the evaluation will be completed by another provider, this initial triage assessment does not replace that evaluation, and the importance of remaining in the ED until their evaluation is complete.  Patient was sent here for ultrasound from OB/GYN.  Plan to draw abdominal labs and get pregnancy test prior to ordering this.   Adolphus Birchwood, [place] 03/11/21 2132

## 2021-03-11 NOTE — ED Provider Notes (Incomplete)
°  [facility] EMERGENCY DEPARTMENT Provider Note   CSN: [id number] Arrival date & time: 03/11/21  2051     History {Add pertinent medical, surgical, social history, OB history to HPI:1} Chief Complaint  Patient presents with   Heavy Menstrual Bleeding / Fatigue    Emily Rose is a 36 y.o. female.  HPI     Home Medications Prior to Admission medications   Medication Sig Start Date End Date Taking? Authorizing Provider  Acetaminophen (MIDOL PO) Take 2 tablets by mouth every 6 (six) hours as needed (menstural cramping/pain).   Yes [provider]  acetaminophen (TYLENOL) 500 MG tablet Take 1,000 mg by mouth every 6 (six) hours as needed for moderate pain.   Yes [provider]  ferrous sulfate 325 (65 FE) MG tablet Take 325 mg by mouth daily. 02/14/21  Yes [provider]      Allergies    Iodine and Shellfish allergy    Review of Systems   Review of Systems  Physical Exam Updated Vital Signs BP (!) 121/91 (BP Location: Right Arm)    Pulse 66    Temp 98.7 F (37.1 C) (Oral)    Resp 18    Ht 5\' 2"  (1.575 m)    Wt 62 kg    LMP 03/09/2021    SpO2 99%    BMI 25.00 kg/m  Physical Exam  ED Results / Procedures / Treatments   Labs (all labs ordered are listed, but only abnormal results are displayed) Labs Reviewed  CBC WITH DIFFERENTIAL/PLATELET - Abnormal; Notable for the following components:      Result Value   RBC 3.75 (*)    Hemoglobin 11.1 (*)    HCT 35.3 (*)    All other components within normal limits  URINALYSIS, ROUTINE W REFLEX MICROSCOPIC - Abnormal; Notable for the following components:   Hgb urine dipstick LARGE (*)    RBC / HPF >50 (*)    Bacteria, UA FEW (*)    All other components within normal limits  COMPREHENSIVE METABOLIC PANEL  LIPASE, BLOOD  I-STAT BETA HCG BLOOD, ED ([facility], [facility], [facility] ONLY)    EKG None  Radiology No results found.  Procedures Procedures  {Document cardiac monitor, telemetry  assessment procedure when appropriate:1}  Medications Ordered in ED Medications - No data to display  ED Course/ Medical Decision Making/ A&P                           Medical Decision Making Amount and/or Complexity of Data Reviewed Radiology: ordered. ECG/medicine tests: ordered.   ***  {Document critical care time when appropriate:1} {Document review of labs and clinical decision tools ie heart score, Chads2Vasc2 etc:1}  {Document your independent review of radiology images, and any outside records:1} {Document your discussion with family members, caretakers, and with consultants:1} {Document social determinants of health affecting pt's care:1} {Document your decision making why or why not admission, treatments were needed:1} Final Clinical Impression(s) / ED Diagnoses Final diagnoses:  None    Rx / DC Orders ED Discharge Orders     None

## 2021-03-12 ENCOUNTER — Emergency Department (HOSPITAL_COMMUNITY): Payer: Self-pay

## 2021-03-12 MED ORDER — MEGESTROL ACETATE 40 MG PO TABS: ORAL_TABLET | ORAL | 0 refills | Status: DC

## 2021-03-12 NOTE — ED Notes (Signed)
Pt discharged and ambulated out of the ED without difficulty. 

## 2021-03-12 NOTE — Discharge Instructions (Addendum)
It does not appear that you have any findings that are emergent. You are not hemorrhaging and your vital signs and hemoglobin are normal . Take the medication to slow bleeding and see your ob as soon as possible.  Contact a health care provider if: You soak through a pad or tampon every 1 or 2 hours, and this happens every time you have a period. You need to use pads and tampons at the same time because you are bleeding so much. You have nausea, vomiting, diarrhea, or other problems related to medicines you are taking. Get help right away if: You soak through more than a pad or tampon in 1 hour. You pass clots bigger than 1 inch (2.5 cm) wide. You feel short of breath. You feel like your heart is beating too fast. You feel dizzy or you faint. You feel very weak or tired.

## 2021-03-22 ENCOUNTER — Encounter (HOSPITAL_COMMUNITY): Payer: Self-pay | Admitting: Emergency Medicine

## 2021-03-22 ENCOUNTER — Emergency Department (HOSPITAL_COMMUNITY): Payer: Self-pay

## 2021-03-22 ENCOUNTER — Other Ambulatory Visit: Payer: Self-pay

## 2021-03-22 ENCOUNTER — Emergency Department (HOSPITAL_COMMUNITY)
Admission: EM | Admit: 2021-03-22 | Discharge: 2021-03-22 | Disposition: A | Discharge status: Home / Self Care | Payer: Self-pay | Attending: Emergency Medicine | Admitting: Emergency Medicine

## 2021-03-22 DIAGNOSIS — A599 Trichomoniasis, unspecified: Secondary | ICD-10-CM

## 2021-03-22 DIAGNOSIS — B9689 Other specified bacterial agents as the cause of diseases classified elsewhere: Secondary | ICD-10-CM

## 2021-03-22 DIAGNOSIS — R102 Pelvic and perineal pain: Secondary | ICD-10-CM | POA: Insufficient documentation

## 2021-03-22 DIAGNOSIS — N760 Acute vaginitis: Secondary | ICD-10-CM

## 2021-03-22 DIAGNOSIS — N9489 Other specified conditions associated with female genital organs and menstrual cycle: Secondary | ICD-10-CM | POA: Insufficient documentation

## 2021-03-22 DIAGNOSIS — N730 Acute parametritis and pelvic cellulitis: Secondary | ICD-10-CM

## 2021-03-22 DIAGNOSIS — N939 Abnormal uterine and vaginal bleeding, unspecified: Secondary | ICD-10-CM | POA: Insufficient documentation

## 2021-03-22 LAB — COMPREHENSIVE METABOLIC PANEL
ALT: 14 U/L (ref 0–44)
AST: 18 U/L (ref 15–41)
Albumin: 3.8 g/dL (ref 3.5–5.0)
Alkaline Phosphatase: 48 U/L (ref 38–126)
Anion gap: 9 (ref 5–15)
BUN: 7 mg/dL (ref 6–20)
CO2: 25 mmol/L (ref 22–32)
Calcium: 9.5 mg/dL (ref 8.9–10.3)
Chloride: 106 mmol/L (ref 98–111)
Creatinine, Ser: 0.89 mg/dL (ref 0.44–1.00)
GFR, Estimated: >60 mL/min (ref >60)
Glucose, Bld: 92 mg/dL (ref 70–99)
Potassium: 3.6 mmol/L (ref 3.5–5.1)
Sodium: 140 mmol/L (ref 135–145)
Total Bilirubin: 0.1 mg/dL — ABNORMAL LOW (ref 0.3–1.2)
Total Protein: 7.1 g/dL (ref 6.5–8.1)

## 2021-03-22 LAB — URINALYSIS, ROUTINE W REFLEX MICROSCOPIC
Bilirubin Urine: NEGATIVE
Glucose, UA: NEGATIVE mg/dL
Ketones, ur: NEGATIVE mg/dL
Leukocytes,Ua: NEGATIVE
Nitrite: NEGATIVE
Protein, ur: 30 mg/dL — AB
RBC / HPF: >50 RBC/hpf — ABNORMAL HIGH (ref 0–5)
Specific Gravity, Urine: 1.023 (ref 1.005–1.030)
pH: 6 (ref 5.0–8.0)

## 2021-03-22 LAB — WET PREP, GENITAL
Sperm: NONE SEEN
WBC, Wet Prep HPF POC: >=10 — AB (ref <10)
Yeast Wet Prep HPF POC: NONE SEEN

## 2021-03-22 LAB — CBC
HCT: 37.1 % (ref 36.0–46.0)
Hemoglobin: 12.3 g/dL (ref 12.0–15.0)
MCH: 30.7 pg (ref 26.0–34.0)
MCHC: 33.2 g/dL (ref 30.0–36.0)
MCV: 92.5 fL (ref 80.0–100.0)
Platelets: 395 10*3/uL (ref 150–400)
RBC: 4.01 MIL/uL (ref 3.87–5.11)
RDW: 14.6 % (ref 11.5–15.5)
WBC: 4.4 10*3/uL (ref 4.0–10.5)
nRBC: 0 % (ref 0.0–0.2)

## 2021-03-22 LAB — I-STAT BETA HCG BLOOD, ED (MC, WL, AP ONLY): I-stat hCG, quantitative: <5 m[IU]/mL (ref <5)

## 2021-03-22 LAB — LIPASE, BLOOD: Lipase: 52 U/L — ABNORMAL HIGH (ref 11–51)

## 2021-03-22 MED ORDER — METRONIDAZOLE 500 MG PO TABS: 500.0000 mg | ORAL_TABLET | Freq: Two times a day (BID) | ORAL | 0 refills | Status: DC

## 2021-03-22 NOTE — ED Provider Notes (Signed)
Care assumed from previous provider at shift change.  See note for full HPI  In summation 36 year old here for evaluation of vaginal bleeding.  Was seen here approximately 1 week ago for similar.  Was started on Megace.  Had ultrasound at that time which showed fibroids.  Bleeding returned today.  Has some intermittent lower abdominal cramping.  Nonfocal.  Previous provider performed pelvic exam which showed some mild bilateral adnexal tenderness however no cervical motion tenderness.  Wet prep positive for trichomonas, clue cells  Plan on follow-up on ultrasound, if negative DC home with Flagyl, close follow-up with OB/GYN Physical Exam  BP 108/80    Pulse (!) 56    Temp 98.5 F (36.9 C) (Oral)    Resp 14    Ht '5\' 2"'$  (1.575 m)    Wt 54.4 kg    LMP 03/09/2021    SpO2 98%    BMI 21.95 kg/m   Physical Exam Vitals and nursing note reviewed.  Constitutional:      General: She is not in acute distress.    Appearance: She is well-developed. She is not ill-appearing, toxic-appearing or diaphoretic.  HENT:     Head: Atraumatic.  Eyes:     Pupils: Pupils are equal, round, and reactive to light.  Cardiovascular:     Rate and Rhythm: Normal rate.  Pulmonary:     Effort: No respiratory distress.  Abdominal:     General: There is no distension.  Musculoskeletal:        General: Normal range of motion.     Cervical back: Normal range of motion.  Skin:    General: Skin is warm and dry.  Neurological:     General: No focal deficit present.     Mental Status: She is alert.  Psychiatric:        Mood and Affect: Mood normal.    Procedures  Procedures Labs Reviewed  WET PREP, GENITAL - Abnormal; Notable for the following components:      Result Value   Trich, Wet Prep PRESENT (*)    Clue Cells Wet Prep HPF POC PRESENT (*)    WBC, Wet Prep HPF POC >=10 (*)    All other components within normal limits  LIPASE, BLOOD - Abnormal; Notable for the following components:   Lipase 52 (*)     All other components within normal limits  COMPREHENSIVE METABOLIC PANEL - Abnormal; Notable for the following components:   Total Bilirubin 0.1 (*)    All other components within normal limits  URINALYSIS, ROUTINE W REFLEX MICROSCOPIC - Abnormal; Notable for the following components:   APPearance HAZY (*)    Hgb urine dipstick LARGE (*)    Protein, ur 30 (*)    RBC / HPF >50 (*)    Bacteria, UA FEW (*)    Trichomonas, UA PRESENT (*)    All other components within normal limits  CBC  I-STAT BETA HCG BLOOD, ED ([facility], [facility], [facility] ONLY)  GC/CHLAMYDIA PROBE AMP ([place]) NOT AT [facility]   US Transvaginal Non-OB  Result Date: 03/12/2021 CLINICAL DATA:  Left lower quadrant pain and vaginal bleeding. EXAM: TRANSABDOMINAL AND TRANSVAGINAL ULTRASOUND OF PELVIS DOPPLER ULTRASOUND OF OVARIES TECHNIQUE: Both transabdominal and transvaginal ultrasound examinations of the pelvis were performed. Transabdominal technique was performed for global imaging of the pelvis including uterus, ovaries, adnexal regions, and pelvic cul-de-sac. It was necessary to proceed with endovaginal exam following the transabdominal exam to visualize the uterus, endometrium, bilateral ovaries and bilateral adnexa.  Color and duplex Doppler ultrasound was utilized to evaluate blood flow to the ovaries. COMPARISON:  None. FINDINGS: Uterus Measurements: 8.3 cm x 5.1 cm x 5.7 cm = volume: 123.96 mL. Multiple heterogeneous uterine fibroids are seen. The largest measures approximately 2.8 cm x 1.7 cm x 2.7 cm. Endometrium Thickness: 9.13 mm.  No focal abnormality visualized. Right ovary Measurements: 3.1 cm x 1.1 cm x 2.3 cm = volume: 4.22 mL. Normal appearance/no adnexal mass. Left ovary Measurements: 2.4 cm x 1.6 cm x 2.2 cm = volume: 4.38 mL. Normal appearance/no adnexal mass. Pulsed Doppler evaluation demonstrates normal low-resistance arterial and venous waveforms in both ovaries. Other: There is a trace amount of pelvic free fluid. IMPRESSION:  1. Multiple heterogeneous uterine fibroids. 2. Trace amount of pelvic free fluid which is likely physiologic in nature. Electronically Signed   By: Virgina Norfolk M.D.   On: 03/12/2021 01:15   US Pelvis Complete  Result Date: 03/12/2021 CLINICAL DATA:  Left lower quadrant pain and vaginal bleeding. EXAM: TRANSABDOMINAL ULTRASOUND OF PELVIS DOPPLER ULTRASOUND OF OVARIES TECHNIQUE: Transabdominal ultrasound examination of the pelvis was performed including evaluation of the uterus, ovaries, adnexal regions, and pelvic cul-de-sac. Color and duplex Doppler ultrasound was utilized to evaluate blood flow to the ovaries. COMPARISON:  None. FINDINGS: Uterus Measurements: 8.3 cm x 5.1 cm x 5.7 cm = volume: 123.96 mL. Multiple heterogeneous uterine fibroids are seen. The largest measures approximately 2.8 cm x 1.7 cm x 2.7 cm. Endometrium Thickness: 9.13 mm.  No focal abnormality visualized. Right ovary Measurements: 3.1 cm x 1.1 cm x 2.3 cm = volume: 4.22 mL. Normal appearance/no adnexal mass. Left ovary Measurements: 2.4 cm x 1.6 cm x 2.2 cm = volume: 4.38 mL. Normal appearance/no adnexal mass. Pulsed Doppler evaluation demonstrates normal low-resistance arterial and venous waveforms in both ovaries. Other: There is a trace amount of pelvic free fluid. IMPRESSION: 1. Multiple heterogeneous uterine fibroids. 2. Trace amount of pelvic free fluid which is likely physiologic in nature. Electronically Signed   By: Virgina Norfolk M.D.   On: 03/12/2021 01:13   [place] Art/Ven Flow Abd Pelv Doppler  Result Date: 03/12/2021 CLINICAL DATA:  Left lower quadrant pain and vaginal bleeding. EXAM: TRANSABDOMINAL AND TRANSVAGINAL ULTRASOUND OF PELVIS DOPPLER ULTRASOUND OF OVARIES TECHNIQUE: Both transabdominal and transvaginal ultrasound examinations of the pelvis were performed. Transabdominal technique was performed for global imaging of the pelvis including uterus, ovaries, adnexal regions, and pelvic cul-de-sac. It was  necessary to proceed with endovaginal exam following the transabdominal exam to visualize the uterus, endometrium, bilateral ovaries and bilateral adnexa. Color and duplex Doppler ultrasound was utilized to evaluate blood flow to the ovaries. COMPARISON:  None. FINDINGS: Uterus Measurements: 8.3 cm x 5.1 cm x 5.7 cm = volume: 123.96 mL. Multiple heterogeneous uterine fibroids are seen. The largest measures approximately 2.8 cm x 1.7 cm x 2.7 cm. Endometrium Thickness: 9.13 mm.  No focal abnormality visualized. Right ovary Measurements: 3.1 cm x 1.1 cm x 2.3 cm = volume: 4.22 mL. Normal appearance/no adnexal mass. Left ovary Measurements: 2.4 cm x 1.6 cm x 2.2 cm = volume: 4.38 mL. Normal appearance/no adnexal mass. Pulsed Doppler evaluation demonstrates normal low-resistance arterial and venous waveforms in both ovaries. Other: There is a trace amount of pelvic free fluid. IMPRESSION: 1. Multiple heterogeneous uterine fibroids. 2. Trace amount of pelvic free fluid which is likely physiologic in nature. Electronically Signed   By: Virgina Norfolk M.D.   On: 03/12/2021 01:13   US PELVIC COMPLETE W  TRANSVAGINAL AND TORSION R/O  Result Date: 03/22/2021 CLINICAL DATA:  Pelvic inflammatory disease EXAM: TRANSABDOMINAL AND TRANSVAGINAL ULTRASOUND OF PELVIS DOPPLER ULTRASOUND OF OVARIES TECHNIQUE: Both transabdominal and transvaginal ultrasound examinations of the pelvis were performed. Transabdominal technique was performed for global imaging of the pelvis including uterus, ovaries, adnexal regions, and pelvic cul-de-sac. It was necessary to proceed with endovaginal exam following the transabdominal exam to visualize the endometrium and ovaries. Color and duplex Doppler ultrasound was utilized to evaluate blood flow to the ovaries. COMPARISON:  03/12/2021 FINDINGS: Uterus Measurements: 7.5 x 5.0 x 5.6 cm = volume: 110 mL. Multiple uterine fibroids, particularly a submucosal fibroid of the uterine fundus measuring 1.5  cm. Nabothian cysts of the cervix. Endometrium Thickness: 6 mm.  No focal abnormality visualized. Right ovary Measurements: 2.9 x 1.4 x 1.5 cm = volume: 3 mL. Normal appearance/no adnexal mass. Left ovary Measurements: 3.3 x 2.5 x 2.5 cm = volume: 11 mL. Normal appearance/no adnexal mass. Multiple small follicles. Pulsed Doppler evaluation of both ovaries demonstrates normal low-resistance arterial and venous waveforms. Other findings No abnormal free fluid. IMPRESSION: 1. No acute ultrasound findings of the pelvis. 2. No evidence of pelvic or adnexal fluid collection, per reported indication of PID. 3. Multiple uterine fibroids, particularly a submucosal fibroid of the uterine fundus measuring 1.5 cm. Electronically Signed   By: Delanna Ahmadi M.D.   On: 03/22/2021 16:53    ED Course / MDM   Clinical Course as of 03/22/21 1726  Sat Mar 22, 2021  1507 Vag bleeding given Megace, resolved then returned. GU exam with Bl adnexal tenderness, No CMT. FU on [place] and dispo [BH]    Clinical Course User Index [BH] Kerston Landeck A, PA-C   Care assumed from previous provider at shift change.  See note for full HPI.  In summation 36 year old here for evaluation of vaginal bleeding and lower abdominal cramping.  Seen 1 week ago for similar.  I reviewed her labs and imaging as well as notes from that time.  Previous provider did do a pelvic exam which did have mild bilateral adnexal tenderness however no cervical motion tenderness to suggest PID.  Plan is to follow-up on ultrasound  Labs and imaging personally viewed and interpreted : Ultrasound does show uterine fibroids however no fluid collections to suggest TOA.  Discussed labs and imaging with patient.  Will treat with Flagyl for trichomonas and clue cells.  We will have her follow-up with OB/GYN for her vaginal bleeding.  Suspect her bleeding could be caused from her trichomonas infection versus her uterine fibroids causing dysfunctional uterine bleeding.   Her hemoglobin is stable and actually improved from her visit 1 week ago.  Medical Decision Making Amount and/or Complexity of Data Reviewed External Data Reviewed: labs, radiology and notes. Labs: ordered. Decision-making details documented in ED Course. Radiology: ordered and independent interpretation performed. Decision-making details documented in ED Course.  Risk OTC drugs. Prescription drug management. Risk Details: Do not feel patient needs additional labs, imaging, hospitalization at this          Nettie Elm, PA-C 03/22/21 1726    Wyvonnia Dusky, MD 03/22/21 [phone]

## 2021-03-22 NOTE — ED Notes (Signed)
Patient transported to Ultrasound 

## 2021-03-22 NOTE — ED Provider Triage Note (Signed)
Emergency Medicine Provider Triage Evaluation Note ? ?Emily Rose , a 36 y.o. female  was evaluated in triage.  Patient was evaluated on 03/12/2021 for menorrhagia.  She was given Megace to treat her heavy bleeding however she reports that it is made it worse.  She says that for 2 days her period stopped and then it came back later.  Ultimately since it started back she has had worsening cramping and heavier bleeding.  She has not been able to see OB/GYN since that visit. ? ?Review of Systems  ?Positive: Abdominal cramping and heavy bleeding ?Negative: Dizziness, shortness of breath, palpitations ? ?Physical Exam  ?BP (!) 135/97 (BP Location: Right Arm)   Pulse 78   Temp 98.5 ?F (36.9 ?C) (Oral)   Resp 17   Ht '5\' 2"'$  (1.575 m)   Wt 54.4 kg   LMP 03/09/2021   SpO2 99%   BMI 21.95 kg/m?  ?Gen:   Awake, no distress   ?Resp:  Normal effort  ?MSK:   Moves extremities without difficulty  ?Other:  Mild suprapubic tenderness ? ?Medical Decision Making  ?Medically screening exam initiated at 12:42 PM.  Appropriate orders placed.  Emily Rose was informed that the remainder of the evaluation will be completed by another provider, this initial triage assessment does not replace that evaluation, and the importance of remaining in the ED until their evaluation is complete. ? ? ? ? ?  ?Rhae Hammock, PA-C ?03/22/21 1244 ? ?

## 2021-03-22 NOTE — Discharge Instructions (Addendum)
We have treated the trichomonas and bacterial vaginosis with a medication called Flagyl.  ? ?Do not drink alcohol while taking this medication. ? ?Let your partner know that they will need to be treated for the trichomonas infection as well ? ?Follow-up with OB/GYN ?

## 2021-03-22 NOTE — ED Provider Notes (Signed)
[facility] EMERGENCY DEPARTMENT Provider Note   CSN: [id number] Arrival date & time: 03/22/21  1208     History  Chief Complaint  Patient presents with   Abdominal Pain   Vaginal Bleeding    Emily Rose is a 36 y.o. female. With no documented past medical history who presents to the emergency department with abdominal pain.  States that she is having vaginal bleeding and lower pelvic cramping.  She states that she was seen here in the emergency department on 03/11/2021 for the same symptoms.  She states that she was having abnormally heavy bleeding.  She states that she had ultrasound which showed that she had uterine fibroids and was placed on Megace for 5 days.  She states that this slowed and then eventually stopped her bleeding.  She states that she had about 2 days of no vaginal bleeding and has since had return of brown discharge as well as now having frank bleeding from her vagina.  She continues to have mild abdominal cramping.  She denies other vaginal discharge, urinary symptoms, fevers.  She states that she has not been sexually active since before her most recent period which was when she was seen in the emergency department on 03/11/2021.  On review of her ED visit, appears that she was seen for vaginal bleeding.  She did have an ultrasound which showed uterine fibroids and was given Megace 40 mg 3 times daily for 5 days.  She was post to follow-up with OB/GYN which she has not completed since then.  Additionally they offered her a pelvic exam at that time which was declined by the patient.  Abdominal Pain Associated symptoms: vaginal bleeding   Associated symptoms: no diarrhea, no dysuria, no fever, no nausea and no vomiting   Vaginal Bleeding Associated symptoms: abdominal pain   Associated symptoms: no dysuria, no fever and no nausea       Home Medications Prior to Admission medications   Medication Sig Start Date End Date Taking? Authorizing  Provider  Acetaminophen (MIDOL PO) Take 2 tablets by mouth every 6 (six) hours as needed (menstural cramping/pain).    [provider]  acetaminophen (TYLENOL) 500 MG tablet Take 1,000 mg by mouth every 6 (six) hours as needed for moderate pain.    [provider]  ferrous sulfate 325 (65 FE) MG tablet Take 325 mg by mouth daily. 02/14/21   [provider]  megestrol (MEGACE) 40 MG tablet 3 tabs a day for 5 days(all at once) 2 tabs a day for 5 days(all at once) then 1 tablet a day 03/12/21   Margarita Mail, PA-C      Allergies    Iodine and Shellfish allergy    Review of Systems   Review of Systems  Constitutional:  Negative for fever.  Gastrointestinal:  Positive for abdominal pain. Negative for diarrhea, nausea and vomiting.  Genitourinary:  Positive for menstrual problem, pelvic pain and vaginal bleeding. Negative for dysuria.  All other systems reviewed and are negative.  Physical Exam Updated Vital Signs BP (!) 135/97 (BP Location: Right Arm)    Pulse 78    Temp 98.5 F (36.9 C) (Oral)    Resp 17    Ht '5\' 2"'$  (1.575 m)    Wt 54.4 kg    LMP 03/09/2021    SpO2 99%    BMI 21.95 kg/m  Physical Exam Vitals and nursing note reviewed. Exam conducted with a chaperone present.  Constitutional:  General: She is not in acute distress.    Appearance: Normal appearance. She is well-developed. She is not ill-appearing or toxic-appearing.  HENT:     Head: Normocephalic and atraumatic.     Mouth/Throat:     Mouth: Mucous membranes are moist.  Eyes:     General: No scleral icterus.    Extraocular Movements: Extraocular movements intact.  Cardiovascular:     Rate and Rhythm: Normal rate and regular rhythm.     Heart sounds: Normal heart sounds. No murmur heard. Pulmonary:     Effort: Pulmonary effort is normal. No respiratory distress.     Breath sounds: Normal breath sounds.  Abdominal:     General: Abdomen is protuberant. Bowel sounds are normal. There is no  distension.     Palpations: Abdomen is soft.     Tenderness: There is abdominal tenderness in the left lower quadrant. There is no right CVA tenderness or left CVA tenderness.     Hernia: No hernia is present.  Genitourinary:    Exam position: Lithotomy position.     Tanner stage (genital): 5.     Vagina: Bleeding present.     Cervix: Cervical bleeding present. No cervical motion tenderness.     Adnexa:        Right: Tenderness present.        Left: Tenderness present.   Skin:    General: Skin is warm and dry.     Capillary Refill: Capillary refill takes less than 2 seconds.  Neurological:     General: No focal deficit present.     Mental Status: She is alert and oriented to person, place, and time.  Psychiatric:        Mood and Affect: Mood normal.        Behavior: Behavior normal.    ED Results / Procedures / Treatments   Labs (all labs ordered are listed, but only abnormal results are displayed) Labs Reviewed  WET PREP, GENITAL - Abnormal; Notable for the following components:      Result Value   Trich, Wet Prep PRESENT (*)    Clue Cells Wet Prep HPF POC PRESENT (*)    WBC, Wet Prep HPF POC >=10 (*)    All other components within normal limits  LIPASE, BLOOD - Abnormal; Notable for the following components:   Lipase 52 (*)    All other components within normal limits  COMPREHENSIVE METABOLIC PANEL - Abnormal; Notable for the following components:   Total Bilirubin 0.1 (*)    All other components within normal limits  URINALYSIS, ROUTINE W REFLEX MICROSCOPIC - Abnormal; Notable for the following components:   APPearance HAZY (*)    Hgb urine dipstick LARGE (*)    Protein, ur 30 (*)    RBC / HPF >50 (*)    Bacteria, UA FEW (*)    Trichomonas, UA PRESENT (*)    All other components within normal limits  CBC  I-STAT BETA HCG BLOOD, ED ([facility], [facility], [facility] ONLY)  GC/CHLAMYDIA PROBE AMP () NOT AT [facility]   EKG None  Radiology No results  found.  Procedures Procedures   Medications Ordered in ED Medications - No data to display  ED Course/ Medical Decision Making/ A&P Clinical Course as of 03/22/21 1540  Sat Mar 22, 2021  1507 Vag bleeding given Megace, resolved then returned. GU exam with Bl adnexal tenderness, No CMT. FU on [place] and dispo [BH]    Clinical Course User Index [BH] Henderly, Britni  A, PA-C                           Medical Decision Making Amount and/or Complexity of Data Reviewed Labs: ordered. Radiology: ordered. ECG/medicine tests: ordered.  This patient presents to the ED for concern of vaginal bleeding, pelvic pain, this involves an extensive number of treatment options, and is a complaint that carries with it a high risk of complications and morbidity.  The differential diagnosis includes abnormal uterine bleeding, ectopic pregnancy, PID, TOA, UTI, ovarian torsion, diverticulitis, atypical appendicitis   Co morbidities that complicate the patient evaluation None  Additional history obtained:  Additional history obtained from: None External records from outside source obtained and reviewed including: Previous ED visit  Lab Results: I personally ordered, reviewed, and interpreted labs. Pertinent results include: CMP without electrolyte derangement CBC without leukocytosis, anemia I-STAT beta-hCG negative Lipase 52, mildly elevated, not consistent with symptoms UA with large hemoglobin, likely contaminant.  No evidence of UTI.  Trichomonas present Wet prep positive for trichomonas and clue cells GC/chlamydia is pending  Imaging Studies ordered:  I ordered imaging studies which included ultrasound.  I independently reviewed & interpreted imaging & am in agreement with radiology impression. Imaging shows: US Pelvis Complete pending   ED Course: 36 year old female who presents to the emergency department with ongoing vaginal bleeding as well as pelvic cramping.  Physical exam significant  for bilateral adnexal tenderness on bimanual exam.  She does have continued vaginal bleeding on pelvic exam.  There is no CMT tenderness.  Given physical exam findings as well as her being positive for trichomonas as well as bacterial vaginosis we will proceed with repeat ultrasound at this time to evaluate for TOA.  She will need treatment for both trichomonas and BV and may need prolonged treatment for PID given her tenderness.   Not pregnant so doubt ectopic pregnancy as a source of her symptoms at this time. No leukocytosis, fever, doubt septic presentation or other etiologies of infection at this time. Her presentation is not consistent with torsion however she is getting ultrasound to rule out TOA which will also rule out ovarian torsion. UA negative for UTI GC chlamydia pending  Care of patient being handed off to [facility], PA-C at this time.  Patient is pending ultrasound to evaluate for TOA and torsion.  If her imaging is negative she will need to be treated for trichomonas, bacterial vaginosis.  Additionally, if she is agreeable, will likely need ongoing oral contraceptive therapy to control periods and OB/GYN follow-up.  If ultrasound shows other findings, we will treat for those.  Ultimate course of care and disposition to be decided once imaging is complete.  Please see Henderly, PA-C note for completion of care.  Final Clinical Impression(s) / ED Diagnoses Final diagnoses:  None    Rx / DC Orders ED Discharge Orders     None         Mickie Hillier, PA-C 03/22/21 1549    Valarie Merino, MD 03/23/21 [phone]

## 2021-03-22 NOTE — ED Triage Notes (Signed)
Pt states she was seen in ED 1 week ago for vaginal bleeding.  States she was diagnosed with fibroids and given medication to stop bleeding.  States it stopped for 1 day but she is continuing to have brown "break through" bleeding and abd cramping with nausea. ?

## 2021-03-24 LAB — GC/CHLAMYDIA PROBE AMP (~~LOC~~) NOT AT ARMC
Chlamydia: NEGATIVE
Comment: NEGATIVE
Comment: NORMAL
Neisseria Gonorrhea: NEGATIVE

## 2021-05-01 ENCOUNTER — Ambulatory Visit: Payer: Medicaid Other | Admitting: Obstetrics

## 2021-10-15 ENCOUNTER — Other Ambulatory Visit: Payer: Self-pay

## 2021-10-15 ENCOUNTER — Emergency Department (HOSPITAL_COMMUNITY)
Admission: EM | Admit: 2021-10-15 | Discharge: 2021-10-15 | Disposition: A | Discharge status: Home / Self Care | Payer: Medicaid Other | Attending: Emergency Medicine | Admitting: Emergency Medicine

## 2021-10-15 DIAGNOSIS — N920 Excessive and frequent menstruation with regular cycle: Secondary | ICD-10-CM | POA: Insufficient documentation

## 2021-10-15 LAB — BASIC METABOLIC PANEL
Anion gap: 6 (ref 5–15)
BUN: 9 mg/dL (ref 6–20)
CO2: 26 mmol/L (ref 22–32)
Calcium: 9.1 mg/dL (ref 8.9–10.3)
Chloride: 106 mmol/L (ref 98–111)
Creatinine, Ser: 0.81 mg/dL (ref 0.44–1.00)
GFR, Estimated: >60 mL/min (ref >60)
Glucose, Bld: 85 mg/dL (ref 70–99)
Potassium: 3.6 mmol/L (ref 3.5–5.1)
Sodium: 138 mmol/L (ref 135–145)

## 2021-10-15 LAB — CBC WITH DIFFERENTIAL/PLATELET
Abs Immature Granulocytes: 0.02 10*3/uL (ref 0.00–0.07)
Basophils Absolute: 0.1 10*3/uL (ref 0.0–0.1)
Basophils Relative: 1 %
Eosinophils Absolute: 0.5 10*3/uL (ref 0.0–0.5)
Eosinophils Relative: 10 %
HCT: 33.3 % — ABNORMAL LOW (ref 36.0–46.0)
Hemoglobin: 10.9 g/dL — ABNORMAL LOW (ref 12.0–15.0)
Immature Granulocytes: 0 %
Lymphocytes Relative: 34 %
Lymphs Abs: 1.8 10*3/uL (ref 0.7–4.0)
MCH: 29.9 pg (ref 26.0–34.0)
MCHC: 32.7 g/dL (ref 30.0–36.0)
MCV: 91.2 fL (ref 80.0–100.0)
Monocytes Absolute: 0.6 10*3/uL (ref 0.1–1.0)
Monocytes Relative: 11 %
Neutro Abs: 2.3 10*3/uL (ref 1.7–7.7)
Neutrophils Relative %: 44 %
Platelets: 310 10*3/uL (ref 150–400)
RBC: 3.65 MIL/uL — ABNORMAL LOW (ref 3.87–5.11)
RDW: 15.1 % (ref 11.5–15.5)
WBC: 5.3 10*3/uL (ref 4.0–10.5)
nRBC: 0 % (ref 0.0–0.2)

## 2021-10-15 LAB — I-STAT BETA HCG BLOOD, ED (MC, WL, AP ONLY): I-stat hCG, quantitative: <5 m[IU]/mL (ref <5)

## 2021-10-15 NOTE — ED Provider Notes (Signed)
[name] Provider Note   CSN: [id number] Arrival date & time: 10/15/21  0800     History  Chief Complaint  Patient presents with   Vaginal Bleeding   Abdominal Pain    Emily Rose is a 36 y.o. female.  Patient is a 37 year old female with a history of fibroids who is presenting today with heavy vaginal bleeding.  Patient reports that her last 2 cycles have started out light and then she has had heavy bleeding several days into her menses.  Her menses started 2 days ago but she reports this morning she was passing large clots and having significant abdominal cramping that radiated into her back.  She did take some mild all at home and reports now the cramping has much improved.  She is still having bleeding but has not passed any further large clots.  She denies any dysuria, frequency or urgency.  No vaginal discharge other than bleeding.  She denies any fever or vomiting.  She is not currently seeing an OB/GYN due to lack of insurance.  She was taking iron in the past but has not been taking it regularly.  She was last seen for similar symptoms in March.  The history is provided by the patient.  Vaginal Bleeding Associated symptoms: abdominal pain   Abdominal Pain Associated symptoms: vaginal bleeding        Home Medications Prior to Admission medications   Medication Sig Start Date End Date Taking? Authorizing Provider  Acetaminophen (MIDOL PO) Take 2 tablets by mouth every 6 (six) hours as needed (menstural cramping/pain).    [provider]  acetaminophen (TYLENOL) 500 MG tablet Take 1,000 mg by mouth every 6 (six) hours as needed for moderate pain.    [provider]  ferrous sulfate 325 (65 FE) MG tablet Take 325 mg by mouth daily. 02/14/21   [provider]  megestrol (MEGACE) 40 MG tablet 3 tabs a day for 5 days(all at once) 2 tabs a day for 5 days(all at once) then 1 tablet a day 03/12/21   Margarita Mail, PA-C  metroNIDAZOLE (FLAGYL) 500 MG tablet Take 1 tablet (500 mg total) by mouth 2 (two) times daily. 03/22/21   Henderly, Britni A, PA-C      Allergies    Iodine and Shellfish allergy    Review of Systems   Review of Systems  Gastrointestinal:  Positive for abdominal pain.  Genitourinary:  Positive for vaginal bleeding.    Physical Exam Updated Vital Signs BP (!) 140/112   Pulse 64   Temp 98.6 F (37 C) (Oral)   Resp 18   Ht '5\' 2"'$  (1.575 m)   Wt 56.7 kg   LMP 10/13/2021   SpO2 100%   BMI 22.86 kg/m  Physical Exam Vitals and nursing note reviewed.  Constitutional:      General: She is not in acute distress.    Appearance: She is well-developed.  HENT:     Head: Normocephalic and atraumatic.  Eyes:     Pupils: Pupils are equal, round, and reactive to light.  Cardiovascular:     Rate and Rhythm: Normal rate and regular rhythm.     Heart sounds: Normal heart sounds. No murmur heard.    No friction rub.  Pulmonary:     Effort: Pulmonary effort is normal.     Breath sounds: Normal breath sounds. No wheezing or rales.  Abdominal:     General: Bowel sounds are normal. There is  no distension.     Palpations: Abdomen is soft.     Tenderness: There is no abdominal tenderness. There is no guarding or rebound.  Musculoskeletal:        General: No tenderness. Normal range of motion.     Comments: No edema  Skin:    General: Skin is warm and dry.     Findings: No rash.  Neurological:     Mental Status: She is alert and oriented to person, place, and time.     Cranial Nerves: No cranial nerve deficit.  Psychiatric:        Behavior: Behavior normal.     ED Results / Procedures / Treatments   Labs (all labs ordered are listed, but only abnormal results are displayed) Labs Reviewed  CBC WITH DIFFERENTIAL/PLATELET - Abnormal; Notable for the following components:      Result Value   RBC 3.65 (*)    Hemoglobin 10.9 (*)    HCT 33.3 (*)    All other components  within normal limits  BASIC METABOLIC PANEL  I-STAT BETA HCG BLOOD, ED ([facility], [facility], [facility] ONLY)    EKG None  Radiology No results found.  Procedures Procedures    Medications Ordered in ED Medications - No data to display  ED Course/ Medical Decision Making/ A&P                           Medical Decision Making Amount and/or Complexity of Data Reviewed Labs: ordered. Decision-making details documented in ED Course.   Ptpresenting today with a complaint that caries a high risk for morbidity and mortality.  Patient here with heavy vaginal bleeding.  She passed some large clots this morning was having significant suprapubic abdominal and pelvic pain which has improved now with Midol.  Patient has no reproducible abdominal pain on exam and is well-appearing.  She does have a known history of fibroids and had been seen earlier this year for similar symptoms.  She does not currently follow with a specialist due to insurance issues.  She is well-appearing on exam today.  Low suspicion for TOA, ovarian torsion or acute abdominal process.  I independently interpreted patient's labs today and CBC with a hemoglobin of 10.9 from 12.36 months ago but otherwise no other acute findings on CBC or BMP.  Her hCG is negative.  No concern for ectopic pregnancy at this time.  Findings were discussed gust with the patient.  At this time she defers pelvic exam.  She was given outpatient resources for OB/GYN.  She will start back on her iron and return for any further issues.         Final Clinical Impression(s) / ED Diagnoses Final diagnoses:  Menorrhagia with regular cycle    Rx / DC Orders ED Discharge Orders     None         Blanchie Dessert, MD 10/15/21 1045

## 2021-10-15 NOTE — ED Triage Notes (Signed)
Pt. Stated, I woke up this morning and had some stomach pain then I had a huge clot that came out when I stood up. I started my period 2 days ago.

## 2021-10-15 NOTE — Discharge Instructions (Addendum)
Start taking your iron regularly again.  If you start bleeding for more than 7 to 10 days start having shortness of breath, chest pain or passing out you should return to the emergency room.  Otherwise if you do start having regular heavier periods you should follow-up with OB/GYN.  It is fine to take Midol and Tylenol as needed for the discomfort.  When you pass large clots like that it is not unusual to get severe cramping.

## 2021-10-15 NOTE — ED Notes (Signed)
Discharge instructions reviewed with patient. Patient denies any questions or concerns. Patient ambulatory out of ED. 

## 2021-12-19 DIAGNOSIS — Z419 Encounter for procedure for purposes other than remedying health state, unspecified: Secondary | ICD-10-CM | POA: Diagnosis not present

## 2022-01-03 DIAGNOSIS — N939 Abnormal uterine and vaginal bleeding, unspecified: Secondary | ICD-10-CM | POA: Diagnosis not present

## 2022-01-03 DIAGNOSIS — D259 Leiomyoma of uterus, unspecified: Secondary | ICD-10-CM | POA: Diagnosis not present

## 2022-01-19 DIAGNOSIS — Z419 Encounter for procedure for purposes other than remedying health state, unspecified: Secondary | ICD-10-CM | POA: Diagnosis not present

## 2022-01-24 ENCOUNTER — Telehealth: Payer: Self-pay

## 2022-01-24 NOTE — Telephone Encounter (Signed)
Mychart msg sent. AS, CMA 

## 2022-02-02 ENCOUNTER — Emergency Department (HOSPITAL_COMMUNITY): Payer: Medicaid Other

## 2022-02-02 ENCOUNTER — Emergency Department (HOSPITAL_COMMUNITY)
Admission: EM | Admit: 2022-02-02 | Discharge: 2022-02-02 | Disposition: A | Discharge status: Home / Self Care | Payer: Medicaid Other | Attending: Emergency Medicine | Admitting: Emergency Medicine

## 2022-02-02 DIAGNOSIS — N9489 Other specified conditions associated with female genital organs and menstrual cycle: Secondary | ICD-10-CM | POA: Diagnosis not present

## 2022-02-02 DIAGNOSIS — R300 Dysuria: Secondary | ICD-10-CM | POA: Diagnosis not present

## 2022-02-02 DIAGNOSIS — N946 Dysmenorrhea, unspecified: Secondary | ICD-10-CM | POA: Insufficient documentation

## 2022-02-02 DIAGNOSIS — R102 Pelvic and perineal pain: Secondary | ICD-10-CM | POA: Insufficient documentation

## 2022-02-02 DIAGNOSIS — R1030 Lower abdominal pain, unspecified: Secondary | ICD-10-CM | POA: Insufficient documentation

## 2022-02-02 LAB — URINALYSIS, ROUTINE W REFLEX MICROSCOPIC
Bilirubin Urine: NEGATIVE
Glucose, UA: NEGATIVE mg/dL
Ketones, ur: NEGATIVE mg/dL
Nitrite: NEGATIVE
Protein, ur: 100 mg/dL — AB
RBC / HPF: >50 RBC/hpf — ABNORMAL HIGH (ref 0–5)
Specific Gravity, Urine: 1.019 (ref 1.005–1.030)
pH: 8 (ref 5.0–8.0)

## 2022-02-02 LAB — COMPREHENSIVE METABOLIC PANEL
ALT: 19 U/L (ref 0–44)
AST: 21 U/L (ref 15–41)
Albumin: 3.6 g/dL (ref 3.5–5.0)
Alkaline Phosphatase: 49 U/L (ref 38–126)
Anion gap: 7 (ref 5–15)
BUN: 7 mg/dL (ref 6–20)
CO2: 28 mmol/L (ref 22–32)
Calcium: 9.3 mg/dL (ref 8.9–10.3)
Chloride: 103 mmol/L (ref 98–111)
Creatinine, Ser: 0.77 mg/dL (ref 0.44–1.00)
GFR, Estimated: >60 mL/min (ref >60)
Glucose, Bld: 89 mg/dL (ref 70–99)
Potassium: 3.4 mmol/L — ABNORMAL LOW (ref 3.5–5.1)
Sodium: 138 mmol/L (ref 135–145)
Total Bilirubin: 0.3 mg/dL (ref 0.3–1.2)
Total Protein: 7.1 g/dL (ref 6.5–8.1)

## 2022-02-02 LAB — I-STAT BETA HCG BLOOD, ED (MC, WL, AP ONLY): I-stat hCG, quantitative: <5 m[IU]/mL (ref <5)

## 2022-02-02 LAB — CBC
HCT: 32.3 % — ABNORMAL LOW (ref 36.0–46.0)
Hemoglobin: 10.6 g/dL — ABNORMAL LOW (ref 12.0–15.0)
MCH: 30.4 pg (ref 26.0–34.0)
MCHC: 32.8 g/dL (ref 30.0–36.0)
MCV: 92.6 fL (ref 80.0–100.0)
Platelets: 293 10*3/uL (ref 150–400)
RBC: 3.49 MIL/uL — ABNORMAL LOW (ref 3.87–5.11)
RDW: 15.1 % (ref 11.5–15.5)
WBC: 7.1 10*3/uL (ref 4.0–10.5)
nRBC: 0 % (ref 0.0–0.2)

## 2022-02-02 LAB — LIPASE, BLOOD: Lipase: 36 U/L (ref 11–51)

## 2022-02-02 MED ORDER — HYDROCODONE-ACETAMINOPHEN 5-325 MG PO TABS: 1.0000 | ORAL_TABLET | Freq: Four times a day (QID) | ORAL | 0 refills | Status: DC | PRN

## 2022-02-02 MED ORDER — HYDROCODONE-ACETAMINOPHEN 5-325 MG PO TABS
1.0000 | ORAL_TABLET | Freq: Once | ORAL | Status: DC
  Filled 2022-02-02: qty 1

## 2022-02-02 NOTE — ED Provider Triage Note (Signed)
Emergency Medicine Provider Triage Evaluation Note  Emily Rose , a 37 y.o. female  was evaluated in triage.  Pt complains of severe pelvic pain associated with.,  Patient with known history of fibroids.  Reports pain 10 out of 10 today, more severe than normal, no recent ultrasound evaluation, she denies any nausea, vomiting, fever, chills, reports that menstrual cycle has been slightly heavier than normal.  Review of Systems  Positive: Pelvic pain, cramping, abdominal pain Negative: fever, Chills, nausea, vomiting  Physical Exam  BP 117/85   Pulse 71   Temp 98.6 F (37 C) (Oral)   Resp 20   SpO2 100%  Gen:   Awake, no distress   Resp:  Normal effort  MSK:   Moves extremities without difficulty  Other:  Significant tenderness palpation in suprapubic region  Medical Decision Making  Medically screening exam initiated at 1:49 PM.  Appropriate orders placed.  Emily Rose was informed that the remainder of the evaluation will be completed by another provider, this initial triage assessment does not replace that evaluation, and the importance of remaining in the ED until their evaluation is complete.  Workup initiated   Emily Rose, [place] 02/02/22 1350

## 2022-02-02 NOTE — ED Provider Notes (Signed)
[facility] EMERGENCY DEPARTMENT Provider Note   CSN: [id number] Arrival date & time: 02/02/22  1304     History  Chief Complaint  Patient presents with   Abdominal Pain    Emily Rose is a 37 y.o. female.   Abdominal Pain  Patient presents abdominal pain.  Lower abdomen.  Began a couple days ago.  On her menses.  History of ovarian cyst.  States last menses was normal.  States today it was more severe.  Also had some dysuria and has pain with bowel movements.  This is not necessarily that unusual for her menses but usually not this severe.  States pain has been on the left sometimes and on the right other times.   No past medical history on file.  Home Medications Prior to Admission medications   Medication Sig Start Date End Date Taking? Authorizing Provider  HYDROcodone-acetaminophen (NORCO/VICODIN) 5-325 MG tablet Take 1-2 tablets by mouth every 6 (six) hours as needed. 02/02/22  Yes Davonna Belling, MD  Acetaminophen (MIDOL PO) Take 2 tablets by mouth every 6 (six) hours as needed (menstural cramping/pain).    [provider]  ferrous sulfate 325 (65 FE) MG tablet Take 325 mg by mouth daily. 02/14/21   [provider]  megestrol (MEGACE) 40 MG tablet 3 tabs a day for 5 days(all at once) 2 tabs a day for 5 days(all at once) then 1 tablet a day 03/12/21   Margarita Mail, PA-C  metroNIDAZOLE (FLAGYL) 500 MG tablet Take 1 tablet (500 mg total) by mouth 2 (two) times daily. 03/22/21   Henderly, Britni A, PA-C      Allergies    Iodine and Shellfish allergy    Review of Systems   Review of Systems  Gastrointestinal:  Positive for abdominal pain.    Physical Exam Updated Vital Signs BP 117/85   Pulse 71   Temp 98.6 F (37 C) (Oral)   Resp 20   SpO2 100%  Physical Exam Vitals and nursing note reviewed.  Cardiovascular:     Rate and Rhythm: Normal rate.  Abdominal:     Hernia: No hernia is present.     Comments: Lower abdominal  tenderness without rebound or guarding.  More on the right side.  Skin:    General: Skin is warm.  Neurological:     Mental Status: She is alert.     ED Results / Procedures / Treatments   Labs (all labs ordered are listed, but only abnormal results are displayed) Labs Reviewed  COMPREHENSIVE METABOLIC PANEL - Abnormal; Notable for the following components:      Result Value   Potassium 3.4 (*)    All other components within normal limits  CBC - Abnormal; Notable for the following components:   RBC 3.49 (*)    Hemoglobin 10.6 (*)    HCT 32.3 (*)    All other components within normal limits  URINALYSIS, ROUTINE W REFLEX MICROSCOPIC - Abnormal; Notable for the following components:   APPearance CLOUDY (*)    Hgb urine dipstick LARGE (*)    Protein, ur 100 (*)    Leukocytes,Ua TRACE (*)    RBC / HPF >50 (*)    Bacteria, UA RARE (*)    All other components within normal limits  URINE CULTURE  LIPASE, BLOOD  I-STAT BETA HCG BLOOD, ED ([facility], [facility], [facility] ONLY)    EKG None  Radiology US PELVIC COMPLETE W TRANSVAGINAL AND TORSION R/O  Result Date: 02/02/2022 CLINICAL  DATA:  Pelvic pain EXAM: TRANSABDOMINAL AND TRANSVAGINAL ULTRASOUND OF PELVIS DOPPLER ULTRASOUND OF OVARIES TECHNIQUE: Both transabdominal and transvaginal ultrasound examinations of the pelvis were performed. Transabdominal technique was performed for global imaging of the pelvis including uterus, ovaries, adnexal regions, and pelvic cul-de-sac. It was necessary to proceed with endovaginal exam following the transabdominal exam to visualize the ovaries. Color and duplex Doppler ultrasound was utilized to evaluate blood flow to the ovaries. COMPARISON:  03/22/2021 and older FINDINGS: Uterus Measurements: 9.9 x 6.3 x 6.7 = volume: 216.5 mL. Heterogeneous myometrium with focal fibroids. Example anterior midbody measuring 3.7 x 2.9 x 3.4 cm. Previously this measured 3.9 x 3.2 x 4.1 cm. Posterior fundal area measures 3.0 x 2.2 x 2.9  cm. There is also a submucosal fibroid centrally measuring 18 x 11 x 17 mm with some distortion of the underlying endometrium. Endometrium Thickness: 24. Focal mass lesion consistent with a subendometrial fibroid measuring 18 x 11 x 17 mm with distortion. Right ovary Measurements: 3.4 x 1.2 x 1.0 = volume: 2.1 mL. Normal appearance/no adnexal mass. Left ovary Measurements: 2.9 x 1.5 x 2.5 = volume: 5.2 mL. Normal appearance/no adnexal mass. Pulsed Doppler evaluation of both ovaries demonstrates normal low-resistance arterial and venous waveforms. Other findings Small amount of free fluid in the pelvis. IMPRESSION: Multiple uterine fibroids. There is 1 fibroid as well obscuring the thickened endometrium, submucosal component. Small amount of free fluid in the pelvis. If there is further concern of the distribution of fibroids in the associated endometrium, female pelvic MRI could be performed as clinically indicated and when appropriate Electronically Signed   By: Jill Side M.D.   On: 02/02/2022 14:53    Procedures Procedures    Medications Ordered in ED Medications - No data to display  ED Course/ Medical Decision Making/ A&P                             Medical Decision Making Amount and/or Complexity of Data Reviewed Labs: ordered.  Risk Prescription drug management.   Patient with lower abdominal pain.  Similar to previous menses pain.  However it has been more severe.  No fevers.  White count normal.  Differential diagnosis includes dysmenorrhea, appendicitis, UTI.  Appendicitis felt less likely.  Normal white count.  Similar pain in the past that she has had with her menses and has been her on both left and right side.  However has had some dysuria.  Urine showed potential infection and that there was some bacteria but no white cells.  Ultrasound done and showed fibroids.  Discussed with patient and family member.  I think the fibroids are the most likely cause of pain.  Will treat  symptomatically.  Will have follow-up with gynecology.  Supposed to have follow-up in about 2 months but hopefully she can get in sooner.   Urine culture sent and with mild dysuria I would treat if it came back positive.  Discharge home.        Final Clinical Impression(s) / ED Diagnoses Final diagnoses:  Dysmenorrhea    Rx / DC Orders ED Discharge Orders          Ordered    HYDROcodone-acetaminophen (NORCO/VICODIN) 5-325 MG tablet  Every 6 hours PRN        02/02/22 1631              Davonna Belling, MD 02/02/22 2147

## 2022-02-02 NOTE — ED Triage Notes (Signed)
Patient complains of abdominal and lower back cramps that started three days ago. Patient reports some constipation. Patient states she is currently menstruating. Patient is alert, oriented, speaking in complete sentences, and is in no apparent distress at this time.

## 2022-02-02 NOTE — Discharge Instructions (Addendum)
We have also sent a urine culture. You will be notified if it shows an infection.

## 2022-02-04 ENCOUNTER — Telehealth: Payer: Self-pay | Admitting: *Deleted

## 2022-02-04 NOTE — Patient Outreach (Signed)
  Care Coordination [facility] Note Transition Care Management Follow-up Telephone Call Date of discharge and from where: 02/02/22 from [facility] How have you been since you were released from the hospital? "I feel a little better" Any questions or concerns? No  Items Reviewed: Did the pt receive and understand the discharge instructions provided? Yes  Medications obtained and verified?  Patient has not picked up pain medication, but plans to pick it up today Other? No  Any new allergies since your discharge? No  Dietary orders reviewed? No Do you have support at home? Yes   Home Care and Equipment/Supplies: Were home health services ordered? no If so, what is the name of the agency? N/A  Has the agency set up a time to come to the patient's home? not applicable Were any new equipment or medical supplies ordered?  No What is the name of the medical supply agency? N/A Were you able to get the supplies/equipment? not applicable Do you have any questions related to the use of the equipment or supplies? No  Functional Questionnaire: (I = Independent and D = Dependent) ADLs: I  Bathing/Dressing- I  Meal Prep- I  Eating- I  Maintaining continence- I  Transferring/Ambulation- I  Managing Meds- I  Follow up appointments reviewed:  PCP Hospital f/u appt confirmed?  N/A ED visit  . [facility] f/u appt confirmed? Yes  Scheduled to see GYN on 03/26/22 @ 10:35am. Are transportation arrangements needed? No  If their condition worsens, is the pt aware to call PCP or go to the Emergency Dept.? Yes Was the patient provided with contact information for the PCP's office or ED? Yes Was to pt encouraged to call back with questions or concerns? Yes  SDOH assessments and interventions completed:   Yes  Lurena Joiner RN, [place] RN Care Coordinator

## 2022-02-14 DIAGNOSIS — Z113 Encounter for screening for infections with a predominantly sexual mode of transmission: Secondary | ICD-10-CM | POA: Diagnosis not present

## 2022-02-14 DIAGNOSIS — N898 Other specified noninflammatory disorders of vagina: Secondary | ICD-10-CM | POA: Diagnosis not present

## 2022-02-19 ENCOUNTER — Encounter: Payer: Self-pay | Admitting: Family Medicine

## 2022-02-19 ENCOUNTER — Ambulatory Visit (INDEPENDENT_AMBULATORY_CARE_PROVIDER_SITE_OTHER): Payer: Medicaid Other | Admitting: Family Medicine

## 2022-02-19 VITALS — BP 157/102 | HR 60 | Ht 64.0 in | Wt 128.4 lb

## 2022-02-19 DIAGNOSIS — D500 Iron deficiency anemia secondary to blood loss (chronic): Secondary | ICD-10-CM | POA: Insufficient documentation

## 2022-02-19 DIAGNOSIS — D219 Benign neoplasm of connective and other soft tissue, unspecified: Secondary | ICD-10-CM | POA: Diagnosis not present

## 2022-02-19 DIAGNOSIS — N920 Excessive and frequent menstruation with regular cycle: Secondary | ICD-10-CM

## 2022-02-19 DIAGNOSIS — Z7689 Persons encountering health services in other specified circumstances: Secondary | ICD-10-CM | POA: Diagnosis not present

## 2022-02-19 DIAGNOSIS — Z419 Encounter for procedure for purposes other than remedying health state, unspecified: Secondary | ICD-10-CM | POA: Diagnosis not present

## 2022-02-19 HISTORY — DX: Benign neoplasm of connective and other soft tissue, unspecified: D21.9

## 2022-02-19 HISTORY — DX: Excessive and frequent menstruation with regular cycle: N92.0

## 2022-02-19 MED ORDER — FERROUS SULFATE 325 (65 FE) MG PO TBEC: 325.0000 mg | DELAYED_RELEASE_TABLET | Freq: Two times a day (BID) | ORAL | 3 refills | Status: DC

## 2022-02-19 MED ORDER — TRANEXAMIC ACID 650 MG PO TABS: 1300.0000 mg | ORAL_TABLET | Freq: Three times a day (TID) | ORAL | 0 refills | Status: DC

## 2022-02-19 NOTE — Progress Notes (Signed)
New Patient Office Visit  Subjective    Patient ID: Emily Rose, female    DOB: 12-05-1985  Age: 37 y.o. MRN: [id number]  CC:  Chief Complaint  Patient presents with   Establish Care    HPI Emily Rose presents to establish care  She reports period cramps for the last year. She went to ER last week for the pain. Given pain medicines for this. She did use this due to her periods. She has appt with OB but isn't until March. She would like to an appt sooner. She reports her periods are once a month lasts 7 days.  Pt also has iron deficiency anemia due to menorrhagia and periods. She is taking iron pills and needs refills.  Last pap unsure.    Outpatient Encounter Medications as of 02/19/2022  Medication Sig   ferrous sulfate 325 (65 FE) MG EC tablet Take 1 tablet (325 mg total) by mouth 2 (two) times daily.   tranexamic acid (LYSTEDA) 650 MG TABS tablet Take 2 tablets (1,300 mg total) by mouth 3 (three) times daily. Start at the beginning of period   [DISCONTINUED] Acetaminophen (MIDOL PO) Take 2 tablets by mouth every 6 (six) hours as needed (menstural cramping/pain).   [DISCONTINUED] ferrous sulfate 325 (65 FE) MG tablet Take 325 mg by mouth daily.   [DISCONTINUED] HYDROcodone-acetaminophen (NORCO/VICODIN) 5-325 MG tablet Take 1-2 tablets by mouth every 6 (six) hours as needed. (Patient not taking: Reported on 02/04/2022)   [DISCONTINUED] megestrol (MEGACE) 40 MG tablet 3 tabs a day for 5 days(all at once) 2 tabs a day for 5 days(all at once) then 1 tablet a day (Patient not taking: Reported on 02/04/2022)   [DISCONTINUED] metroNIDAZOLE (FLAGYL) 500 MG tablet Take 1 tablet (500 mg total) by mouth 2 (two) times daily. (Patient not taking: Reported on 02/04/2022)   No facility-administered encounter medications on file as of 02/19/2022.    Past Medical History:  Diagnosis Date   Fibroid 02/19/2022   Menorrhagia with regular cycle 02/19/2022    No past surgical history on  file.  No family history on file.  Social History   Socioeconomic History   Marital status: Single    Spouse name: Not on file   Number of children: Not on file   Years of education: Not on file   Highest education level: Not on file  Occupational History   Not on file  Tobacco Use   Smoking status: Every Day   Smokeless tobacco: Never  Substance and Sexual Activity   Alcohol use: Yes   Drug use: No   Sexual activity: Not on file  Other Topics Concern   Not on file  Social History Narrative   Not on file   Social Determinants of Health   Financial Resource Strain: Not on file  Food Insecurity: Not on file  Transportation Needs: No Transportation Needs (02/04/2022)   PRAPARE - Hydrologist (Medical): No    Lack of Transportation (Non-Medical): No  Physical Activity: Not on file  Stress: Not on file  Social Connections: Not on file  Intimate Partner Violence: Not on file    Review of Systems  Genitourinary:        Painful periods   All other systems reviewed and are negative.       Objective    BP (!) 157/102   Pulse 60   Ht '5\' 4"'$  (1.626 m)   Wt 128 lb 6.4 oz (58.2 kg)  SpO2 99%   BMI 22.04 kg/m   Physical Exam Vitals and nursing note reviewed.  Constitutional:      Appearance: Normal appearance. She is normal weight.  HENT:     Head: Normocephalic and atraumatic.     Right Ear: External ear normal.     Left Ear: External ear normal.     Nose: Nose normal.     Mouth/Throat:     Mouth: Mucous membranes are moist.  Cardiovascular:     Rate and Rhythm: Normal rate and regular rhythm.     Pulses: Normal pulses.     Heart sounds: Normal heart sounds.  Pulmonary:     Effort: Pulmonary effort is normal.     Breath sounds: Normal breath sounds.  Skin:    Capillary Refill: Capillary refill takes less than 2 seconds.  Neurological:     General: No focal deficit present.     Mental Status: She is alert and oriented to  person, place, and time. Mental status is at baseline.  Psychiatric:        Mood and Affect: Mood normal.        Behavior: Behavior normal.        Thought Content: Thought content normal.        Judgment: Judgment normal.        Assessment & Plan:   Problem List Items Addressed This Visit       Other   Menorrhagia with regular cycle   Relevant Medications   tranexamic acid (LYSTEDA) 650 MG TABS tablet   ferrous sulfate 325 (65 FE) MG EC tablet   Other Relevant Orders   Ambulatory referral to Obstetrics / Gynecology   Fibroid   Relevant Orders   Ambulatory referral to Obstetrics / Gynecology   Iron deficiency anemia due to chronic blood loss   Relevant Medications   ferrous sulfate 325 (65 FE) MG EC tablet   Other Visit Diagnoses     Encounter to establish care with new doctor    -  Primary     Add iron supplements BID due to iron deficiency anemia secondary to fibroids and chronic blood loss. To refer to OB/Gyn for management of fibroids. Add Lysteda for menorrhagia.  Return in about 3 months (around 05/20/2022) for Annual Physical with pap.   Emily Rio, MD

## 2022-03-20 DIAGNOSIS — Z419 Encounter for procedure for purposes other than remedying health state, unspecified: Secondary | ICD-10-CM | POA: Diagnosis not present

## 2022-03-26 ENCOUNTER — Other Ambulatory Visit (HOSPITAL_COMMUNITY)
Admission: RE | Admit: 2022-03-26 | Discharge: 2022-03-26 | Disposition: A | Discharge status: Home / Self Care | Payer: Medicaid Other | Source: Ambulatory Visit | Attending: Family Medicine | Admitting: Family Medicine

## 2022-03-26 ENCOUNTER — Encounter: Payer: Self-pay | Admitting: Family Medicine

## 2022-03-26 ENCOUNTER — Ambulatory Visit (INDEPENDENT_AMBULATORY_CARE_PROVIDER_SITE_OTHER): Payer: Medicaid Other | Admitting: Family Medicine

## 2022-03-26 VITALS — BP 130/92 | HR 67 | Wt 135.3 lb

## 2022-03-26 DIAGNOSIS — D219 Benign neoplasm of connective and other soft tissue, unspecified: Secondary | ICD-10-CM

## 2022-03-26 DIAGNOSIS — D259 Leiomyoma of uterus, unspecified: Secondary | ICD-10-CM

## 2022-03-26 DIAGNOSIS — Z124 Encounter for screening for malignant neoplasm of cervix: Secondary | ICD-10-CM | POA: Insufficient documentation

## 2022-03-26 DIAGNOSIS — Z01411 Encounter for gynecological examination (general) (routine) with abnormal findings: Secondary | ICD-10-CM

## 2022-03-26 DIAGNOSIS — D500 Iron deficiency anemia secondary to blood loss (chronic): Secondary | ICD-10-CM | POA: Diagnosis not present

## 2022-03-26 DIAGNOSIS — N920 Excessive and frequent menstruation with regular cycle: Secondary | ICD-10-CM

## 2022-03-26 DIAGNOSIS — Z01419 Encounter for gynecological examination (general) (routine) without abnormal findings: Secondary | ICD-10-CM | POA: Diagnosis not present

## 2022-03-26 DIAGNOSIS — Z23 Encounter for immunization: Secondary | ICD-10-CM | POA: Diagnosis not present

## 2022-03-26 DIAGNOSIS — Z7689 Persons encountering health services in other specified circumstances: Secondary | ICD-10-CM
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
## 2022-03-26 NOTE — Patient Instructions (Signed)

## 2022-03-30 NOTE — Assessment & Plan Note (Addendum)
99204 - To try Lysteda for now

## 2022-03-30 NOTE — Assessment & Plan Note (Addendum)
[id number] - Anemic in 01/2022--continue lysteda

## 2022-03-30 NOTE — Assessment & Plan Note (Addendum)
[id number] - U/s 1 year ago, showed multiple fibroids, but normal uterine size--repeat.

## 2022-04-01 LAB — CYTOLOGY - PAP
Comment: NEGATIVE
Diagnosis: NEGATIVE
High risk HPV: NEGATIVE

## 2022-04-10 NOTE — Progress Notes (Unsigned)
  Subjective:    Emily Rose - 37 y.o. female MRN [id number]  Date of birth: [birth year]  HPI  Emily Rose is to establish care.   Current issues and/or concerns: 02/19/2022 [facility] [place] per HPI   Health Maintenance:  Health Maintenance Due  Topic Date Due   COVID-19 Vaccine (1) Never done     Past Medical History: Patient Active Problem List   Diagnosis Date Noted   Menorrhagia with regular cycle 02/19/2022   Fibroid 02/19/2022   Iron deficiency anemia due to chronic blood loss 02/19/2022      Social History   reports that she has been smoking cigarettes. She has been smoking an average of .25 packs per day. She has never used smokeless tobacco. She reports current alcohol use of about 2.0 standard drinks of alcohol per week. She reports that she does not use drugs.   Family History  family history is not on file.   Medications: reviewed and updated   Objective:   Physical Exam LMP 03/25/2022  Physical Exam      Assessment & Plan:         Patient was given clear instructions to go to Emergency Department or return to medical center if symptoms don't improve, worsen, or new problems develop.The patient verbalized understanding.  I discussed the assessment and treatment plan with the patient. The patient was provided an opportunity to ask questions and all were answered. The patient agreed with the plan and demonstrated an understanding of the instructions.   The patient was advised to call back or seek an in-person evaluation if the symptoms worsen or if the condition fails to improve as anticipated.    Durene Fruits, NP 04/10/2022, 12:36 PM Primary Care at [facility]

## 2022-04-14 ENCOUNTER — Encounter: Payer: Medicaid Other | Admitting: Family

## 2022-04-14 DIAGNOSIS — Z7689 Persons encountering health services in other specified circumstances: Secondary | ICD-10-CM

## 2022-04-20 DIAGNOSIS — Z419 Encounter for procedure for purposes other than remedying health state, unspecified: Secondary | ICD-10-CM | POA: Diagnosis not present

## 2022-04-22 ENCOUNTER — Encounter: Payer: Self-pay | Admitting: Neurology

## 2022-04-22 ENCOUNTER — Ambulatory Visit: Payer: Medicaid Other | Admitting: Neurology

## 2022-04-22 VITALS — BP 133/87 | HR 97 | Ht 62.0 in | Wt 132.4 lb

## 2022-04-22 DIAGNOSIS — Z024 Encounter for examination for driving license: Secondary | ICD-10-CM

## 2022-04-22 DIAGNOSIS — D500 Iron deficiency anemia secondary to blood loss (chronic): Secondary | ICD-10-CM

## 2022-04-22 NOTE — Progress Notes (Signed)
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 Has license form [place]. Patient states she sleeps well at night and sleeping isn't a problem.       HISTORY OF PRESENT ILLNESS:   04-22-2022:  Emily Rose is a new  37 y.o. female patient who is seen upon referral on 04/22/2022 from Dr Emily Rose  for a Sleep evaluation- based on a note on her [place] license.  She never had a drivers license in [place], she drive under a [employer].  Chief concern according to patient :  " I need a drivers license"    I have the pleasure of seeing Emily Rose on  04/22/22 a right -handed  AA female with a restriction on obtaining a drivers license due to a reported sleep disorder.  She never had a sleep disorder, never any testing and her referral is not accompanied by any medical notes addressing the issue.       Sleep relevant medical history: No ENT surgery, no TBI, cervical spine injury or  hypersomnia, not known to snore, no insomnia.    Family medical /sleep history: no other family member on CPAP with OSA    Social history:  Patient moved to [facility] in 2022 is working as Control and instrumentation engineer at [employer],  and lives in a household with her mother and 5 persons, Family status is single , without children,  The patient currently works in day time  Tobacco use; 5 /a day .   ETOH use , weekends , 4 drinks a week ,  Caffeine intake in form of Coffee( /) Soda( 2 a day) Tea ( 3 a week) or energy drinks Exercise in form of walking.         Sleep  habits are as follows: The patient's dinner time is between 6-7 PM. The patient goes to bed at 10 PM and continues to sleep for 7 hours, wakes for 0-1 bathroom break, The preferred sleep position is side, with the support of 0 pillows.  Dreams are reportedly frequent/vivid.  The patient wakes up  with an alarm. 6.00  AM is the usual rise time.  She reports  feeling refreshed or restored in AM, with symptoms such as dry mouth, morning headaches, and residual fatigue.  Naps are not taken    Review of Systems: Out of a complete 14 system review, the patient complains of only the following symptoms, and all other reviewed systems are negative.:     Cramping,  Menstrual cycles are heavy but irregular, fibroids. Anemia.    How likely are you to doze in the following situations: 0 = not likely, 1 = slight chance, 2 = moderate chance, 3 = high chance   Sitting and Reading? Watching Television? Sitting inactive in a public place (theater or meeting)? As a passenger in a car for an hour without a break? Lying down  in the afternoon when circumstances permit? Sitting and talking to someone? Sitting quietly after lunch without alcohol? In a car, while stopped for a few minutes in traffic?   Total = 2/ 24 points   FSS endorsed at 20/ 63 points.   Social History   Socioeconomic History   Marital status: Single    Spouse name: Not on file   Number of children: Not on file   Years of education: Not on file   Highest education level: Not on file  Occupational History   Not on file  Tobacco Use   Smoking status: Every Day    Packs/day: .25    Types: Cigarettes   Smokeless tobacco: Never   Tobacco comments:    3 cigarettes / day   Vaping Use   Vaping Use: Never used  Substance and Sexual Activity   Alcohol use: Yes    Alcohol/week: 2.0 standard drinks of alcohol    Types: 1 Cans of beer, 1 Standard drinks or equivalent per week    Comment: weekends usually   Drug use: No   Sexual  activity: Not on file  Other Topics Concern   Not on file  Social History Narrative   Works for a Education officer, environmental   Social Determinants of Health   Financial Resource Strain: Not on file  Food Insecurity: No Food Insecurity (03/26/2022)   Hunger Vital Sign    Worried About Running Out of Food in the Last Year: Never true    Ran Out of Food in the Last Year: Never true  Transportation Needs: No Transportation Needs (03/26/2022)   PRAPARE - Hydrologist (Medical): No    Lack of Transportation (Non-Medical): No  Physical Activity: Not on file  Stress: Not on file  Social Connections: Not on file    History reviewed. No pertinent family history.  Past Medical History:  Diagnosis Date   Fibroid 02/19/2022   Menorrhagia with regular cycle 02/19/2022    Past Surgical History:  Procedure Laterality Date   DILATION AND CURETTAGE OF UTERUS       Current Outpatient Medications on File Prior to Visit  Medication Sig Dispense Refill   ferrous sulfate 325 (65 FE) MG EC tablet Take 1 tablet (325 mg total) by mouth 2 (two) times daily. (Patient taking differently: Take 325 mg by mouth daily.) 60 tablet 3   tranexamic acid (LYSTEDA) 650 MG TABS tablet Take 2 tablets (1,300 mg total) by mouth 3 (three) times daily. Start at the beginning of period 30 tablet 0   No current facility-administered medications on file prior to visit.    Allergies  Allergen Reactions   Iodine    Shellfish Allergy Swelling    Itchy throat w/facial swelling... sts she recently had shrimp 06/2016 and had no reaction      DIAGNOSTIC DATA (LABS, IMAGING, TESTING) - I reviewed patient records, labs, notes, testing and imaging myself where available.  Lab Results  Component Value Date   WBC 7.1 02/02/2022   HGB 10.6 (L) 02/02/2022   HCT 32.3 (L) 02/02/2022   MCV 92.6 02/02/2022   PLT 293 02/02/2022      Component Value Date/Time   NA 138 02/02/2022 1324   K 3.4 (L) 02/02/2022 1324    CL 103 02/02/2022 1324   CO2 28 02/02/2022 1324   GLUCOSE 89 02/02/2022 1324   BUN 7 02/02/2022 1324   CREATININE 0.77 02/02/2022 1324   CALCIUM 9.3 02/02/2022 1324  PROT 7.1 02/02/2022 1324   ALBUMIN 3.6 02/02/2022 1324   AST 21 02/02/2022 1324   ALT 19 02/02/2022 1324   ALKPHOS 49 02/02/2022 1324   BILITOT 0.3 02/02/2022 1324   GFRNONAA >60 02/02/2022 1324   No results found for: "CHOL", "HDL", "LDLCALC", "LDLDIRECT", "TRIG", "CHOLHDL" No results found for: "HGBA1C" No results found for: "VITAMINB12" No results found for: "TSH"  PHYSICAL EXAM:  Today's Vitals   04/22/22 1124  BP: 133/87  Pulse: 97  Weight: 132 lb 6.4 oz (60.1 kg)  Height: 5\' 2"  (1.575 m)   Body mass index is 24.22 kg/m.   Wt Readings from Last 3 Encounters:  04/22/22 132 lb 6.4 oz (60.1 kg)  03/26/22 135 lb 4.8 oz (61.4 kg)  02/19/22 128 lb 6.4 oz (58.2 kg)     Ht Readings from Last 3 Encounters:  04/22/22 5\' 2"  (1.575 m)  02/19/22 5\' 4"  (1.626 m)  10/15/21 5\' 2"  (1.575 m)      General: The patient is awake, alert and appears not in acute distress. The patient is well groomed. Head: Normocephalic, atraumatic. Neck is supple. Mallampati 3,  neck circumference:13. 5 inches . Nasal airflow congested, seasonal -Retrognathia is mildly  seen.  Dental status: biological  Cardiovascular:  Regular rate and cardiac rhythm by pulse,  without distended neck veins. Respiratory: Lungs are clear to auscultation.  Skin:  Without evidence of ankle edema, or rash. Trunk: The patient's posture is erect.   NEUROLOGIC EXAM: The patient is awake and alert, oriented to place and time.   Memory subjective described as intact.  Attention span & concentration ability appears normal.  Speech is fluent,  without  dysarthria, dysphonia or aphasia.  Mood and affect are appropriate.   Cranial nerves: no loss of smell or taste reported  Pupils are equal and briskly reactive to light. Funduscopic exam deferred. .   Extraocular movements in vertical and horizontal planes were intact and without nystagmus.  No Diplopia. Visual fields by finger perimetry are intact. Hearing was intact to soft voice and finger rubbing.    Facial sensation intact to fine touch.  Facial motor strength is symmetric and tongue and uvula move midline.  Neck ROM : rotation, tilt and flexion extension were normal for age and shoulder shrug was symmetrical.    Motor exam:  Symmetric bulk, tone and ROM.   Normal tone without cog wheeling, symmetric grip strength .   Sensory:  Fine touch, pinprick and vibration were tested  and  normal.  Proprioception tested in the upper extremities was normal.   Coordination: Rapid alternating movements in the fingers/hands were of normal speed.    Gait and station: Patient could rise unassisted from a seated position, walked without assistive device.  Stance is of normal width/ base and the patient turned with 3 steps.  Toe and heel walk were deferred.  Deep tendon reflexes: in the  upper and lower extremities are symmetric and intact.  Babinski response was deferred .  ASSESSMENT AND PLAN 37 y.o. year old female  here with: no neurological findings.     1) OB GYN wrote for iron supplements, patient has iron def anemia due to heavy  menstrual periods   2) she has reported that she has no knowledge of a sleep disorder and was surprised to find out about a  note in he record.  She has no documented history or testing for/ of a sleep disorder and stated she was never ever diagnosed with a  sleep disorder.   PLAN : we will order a HST to screen for apnea, she denies RLS, Insomnia and EDS.       I plan only to follow up either personally or through our NP if the HST is positive/ abnormal.   If the HST is negative  we do not need to meet again- medicaid may insist on I lab testing , in that case we will schedule for PSG at [facility] .  within 3-5 months.   I would like to thank  Emily Jude, [address] [place],  [place] [id number] for allowing me to meet with and to take care of this pleasant patient.   CC: I will share my notes with PCP as well .  After spending a total time of  35  minutes face to face and additional time for physical and neurologic examination, review of laboratory studies,  personal review of imaging studies, reports and results of other testing and review of referral information / records as far as provided in visit,   Electronically signed by: Emily Seat, MD 04/22/2022 11:50 AM  [facility] Board certified by The [employer] of Sleep Medicine and Diplomate of the [employer] of Sleep Medicine. Board certified In Neurology through the [employer], Fellow of the [employer] of Neurology. Medical Director of [employer].

## 2022-04-29 ENCOUNTER — Telehealth: Payer: Self-pay | Admitting: Neurology

## 2022-04-29 NOTE — Telephone Encounter (Signed)
Pt called. Stated she is following up on Sleep Study. She is requesting a call back

## 2022-04-29 NOTE — Telephone Encounter (Signed)
NPSG [employer] [employer] pending uploaded notes on the portal

## 2022-05-04 NOTE — Telephone Encounter (Signed)
Checked status on the portal it is still pending.  

## 2022-05-07 NOTE — Telephone Encounter (Signed)
HST- [employer] Berkley Harvey: [id number] (exp. 05/07/22 to 08/05/22)  Patient is scheduled at [facility] for 05/19/22 at 10:15 AM  Mailed packet to the patient.

## 2022-05-07 NOTE — Telephone Encounter (Signed)
MCD [employer] denied the NPSG- denial reason below. Start a new case for the HST.

## 2022-05-19 ENCOUNTER — Ambulatory Visit: Payer: Medicaid Other | Admitting: Neurology

## 2022-05-19 DIAGNOSIS — Z024 Encounter for examination for driving license: Secondary | ICD-10-CM

## 2022-05-19 DIAGNOSIS — G4733 Obstructive sleep apnea (adult) (pediatric): Secondary | ICD-10-CM | POA: Diagnosis not present

## 2022-05-19 DIAGNOSIS — D500 Iron deficiency anemia secondary to blood loss (chronic): Secondary | ICD-10-CM

## 2022-05-20 DIAGNOSIS — Z419 Encounter for procedure for purposes other than remedying health state, unspecified: Secondary | ICD-10-CM | POA: Diagnosis not present
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
## 2022-05-25 ENCOUNTER — Telehealth: Payer: Self-pay | Admitting: Neurology

## 2022-05-25 NOTE — Telephone Encounter (Signed)
Pt is asking for call with results to her sleep study.

## 2022-05-25 NOTE — Telephone Encounter (Signed)
Dr Vickey Huger has not yet reviewed the sleep study for the patient. 05/19/2022 is when the HST was completed. Will see if Dr Vickey Huger will review and will call patient as soon as results are available.

## 2022-05-25 NOTE — Procedures (Signed)
[facility] Emily Rose 37 year old female Jun 13, 1985   HOME SLEEP TEST REPORT ( by Watch PAT)   STUDY DATA:  05-21-2022   ORDERING CLINICIAN: Melvyn Novas, MD  REFERRING CLINICIAN: Dr Wyline Mood, MD   CLINICAL INFORMATION/HISTORY: 04-22-2022  NEW SLEEP CONSULT Patient to [facility] - referred by [facility] GYN Dr Shawnie Pons, states she needs to get a sleep study test so she would be able to renew her license, I have the pleasure of seeing Emily Rose on 04/22/22 . This patient is a right -handed  AA female with a restriction on obtaining a driver's license due to a police reported sleep disorder.  She never had a sleep disorder, never any testing and her referral is not accompanied by any medical notes addressing the issue.      Epworth sleepiness score: 2 /24.  FSS at 20/63 points.   BMI: 24.22 kg/m   Neck Circumference: 13.5"   FINDINGS:   Sleep Summary:   Total Recording Time (hours, min):    6 hours 30 minutes   Total Sleep Time (hours, min):   4 hours 45 minutes              Percent REM (%):   31%     Sleep latency was 16 minutes and REM sleep latency 52 minutes.                                  Respiratory Indices:   Calculated pAHI (per hour):    By AASM criteria the total AHI was 6.8/h                         REM pAHI:   15/h                                              NREM pAHI:    3.1/h                          Positional AHI:     The patient slept 152 minutes in supine associated with an AHI of 11/h sleep on the right and left side was however associated with very low AHI's of 0.7/h and 5.6/h.  Snoring level reached a mean volume of 41 dB snoring was only measured for 13% of the sleep time.                                              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    RECOMMENDATION: This patient would not need treatment with positive airway pressure if she can avoid sleeping supine.  Her apnea was strictly dependent on the position she sleeps in and she would have to sleep on her left and right side and avoid supine sleep in order to reach an apnea hypopnea index of less then 5/h.  This very very mild degree of apnea would always be regarded as treatment optional. This should not restrict the patient from operating machinery or driving a vehicle.    INTERPRETING PHYSICIAN:   Melvyn Novas, MD  [facility] at [facility].

## 2022-05-25 NOTE — Progress Notes (Signed)
Mild, very mild sleep apnea and no need for CPAP intervention. Avoiding sleep on her  back is the only recommendation. The result of this HST should allow the patient to obtain an unrestricted drivers licence.

## 2022-05-26 NOTE — Telephone Encounter (Signed)
PHONE STAFF CAN RELAY  Contacted pt, LVM rq call back   Please inform her she has very mild sleep apnea and no need for CPAP therapy. Her AHI was 6.8/h(normal is less than 5). advise her to avoid sleeping on her back to reduce apnea. The result of this HST should allow the patient to obtain an unrestricted drivers license.       Dohmeier, Porfirio Mylar, MD  P Gna-Pod 3 Results Cc: Suzan Slick, MD Mild, very mild sleep apnea and no need for CPAP intervention. Avoiding sleep on her  back is the only recommendation. The result of this HST should allow the patient to obtain an unrestricted drivers licence.

## 2022-05-27 ENCOUNTER — Telehealth: Payer: Self-pay

## 2022-05-27 NOTE — Telephone Encounter (Signed)
Pt LVM on my phone with name, DOB, and call back # - assuming she is calling back about results from sleep study. Routing this to Dohmeier's pod so the correct person can reach back out to the patient about results.

## 2022-05-27 NOTE — Telephone Encounter (Addendum)
Contacted pt back, informed her she has very mild sleep apnea and no need for CPAP therapy. Her AHI was 6.8/h(normal is less than 5). advise her to avoid sleeping on her back to reduce apnea. The result of this HST should allow the patient to obtain an unrestricted drivers license.  Patient verbally verbally understood the results.  Advised to call the office back with any questions or concerns as she had none at this time.  She stated she will come into office today to pick up copy. I have printed results and placed at front desk.
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
## 2022-05-27 NOTE — Telephone Encounter (Signed)
Addressed in TE 5/6

## 2022-05-27 NOTE — Telephone Encounter (Signed)
Emily Rose B, [place]    05/27/22  3:21 PM Note Pt LVM on my phone with name, DOB, and call back # - assuming she is calling back about results from sleep study. Routing this to Dohmeier's pod so the correct person can reach back out to the patient about results.

## 2022-06-09 ENCOUNTER — Telehealth: Payer: Self-pay | Admitting: Neurology

## 2022-06-09 NOTE — Telephone Encounter (Signed)
Error

## 2022-06-10 DIAGNOSIS — Z0289 Encounter for other administrative examinations: Secondary | ICD-10-CM

## 2022-06-16 NOTE — Telephone Encounter (Signed)
Patient left a voicemail on my phone stating she wanted a call back about her sleep study results.

## 2022-06-16 NOTE — Telephone Encounter (Signed)
I spoke with the patient. She was inquiring about her form for the driver's license. The form is in Dr. Oliva Bustard basket. I informed the patient the form was not ready yet and we will let her know when it is completed. She verbalized understanding/appreciation for the call.

## 2022-06-22 ENCOUNTER — Telehealth: Payer: Self-pay | Admitting: Neurology

## 2022-06-22 NOTE — Telephone Encounter (Signed)
I have completed form for the patient to be able to drive. I placed in pod 4 for Dr Lucia Gaskins to sign in Dr Dohmeier's absence

## 2022-06-22 NOTE — Telephone Encounter (Signed)
Form completed and signed by Dr Lucia Gaskins in Dr Dohmeier's absence. I have given to [facility] medical records

## 2022-06-23 ENCOUNTER — Telehealth: Payer: Self-pay | Admitting: *Deleted

## 2022-06-23 NOTE — Telephone Encounter (Signed)
Pt DMV form faxed on 06/23/2022

## 2022-06-24 NOTE — Telephone Encounter (Signed)
Will discuss with Dr Vickey Huger upon her return Monday.
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
## 2022-06-29 NOTE — Telephone Encounter (Addendum)
Forms placed in MR for pick up  
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
## 2022-06-29 NOTE — Telephone Encounter (Signed)
Stanton Kidney can you refax with correct accident date listed below? Ty

## 2022-06-30 NOTE — Telephone Encounter (Signed)
Patients DMV form is with POD 3 - we are waiting for Dr Vickey Huger to amend the note to reflect her accident date.

## 2022-07-01 NOTE — Telephone Encounter (Signed)
Pt dmv form@ the front desk for p/u 

## 2022-07-01 NOTE — Telephone Encounter (Addendum)
We are unable to note something that happened in 2014. We are unable to state if pt had OSA at that time. We did not see or evaluate her until 04/22/22. Forms given back to [place].

## 2022-07-02 ENCOUNTER — Telehealth: Payer: Self-pay | Admitting: *Deleted

## 2022-07-02 NOTE — Telephone Encounter (Signed)
Pt called and was informed.  

## 2022-07-02 NOTE — Telephone Encounter (Signed)
I Called pt left message. Pt form ready for p/u

## 2022-10-02 ENCOUNTER — Telehealth: Payer: Self-pay | Admitting: Family Medicine

## 2022-10-02 ENCOUNTER — Other Ambulatory Visit: Payer: Self-pay | Admitting: Family Medicine

## 2022-10-02 DIAGNOSIS — N920 Excessive and frequent menstruation with regular cycle: Secondary | ICD-10-CM

## 2022-10-02 NOTE — Telephone Encounter (Signed)
Prescription Request  10/02/2022  LOV: 02/19/2022  What is the name of the medication or equipment?  tranexamic acid (LYSTEDA) 650 MG TABS tablet   Have you contacted your pharmacy to request a refill? Yes   Which pharmacy would you like this sent to?  [id number] [place] Otto, [place] - [phone] AT [facility] OF [address] [place] [address] [place] [address] Phone: [phone] Fax: [phone]   Patient notified that their request is being sent to the clinical staff for review and that they should receive a response within 2 business days.   Please advise at Mobile [phone] (mobile)

## 2022-10-05 NOTE — Telephone Encounter (Signed)
Message sent to provider for approval

## 2022-12-03 ENCOUNTER — Ambulatory Visit
Admission: EM | Admit: 2022-12-03 | Discharge: 2022-12-03 | Disposition: A | Discharge status: Home / Self Care | Payer: Medicaid Other | Attending: Emergency Medicine | Admitting: Emergency Medicine

## 2022-12-03 DIAGNOSIS — R051 Acute cough: Secondary | ICD-10-CM | POA: Diagnosis not present

## 2022-12-03 DIAGNOSIS — J069 Acute upper respiratory infection, unspecified: Secondary | ICD-10-CM | POA: Diagnosis not present

## 2022-12-03 DIAGNOSIS — Z20822 Contact with and (suspected) exposure to covid-19: Secondary | ICD-10-CM | POA: Diagnosis not present

## 2022-12-03 LAB — POC COVID19/FLU A&B COMBO
Covid Antigen, POC: NEGATIVE
Influenza A Antigen, POC: NEGATIVE
Influenza B Antigen, POC: NEGATIVE

## 2022-12-03 MED ORDER — AEROCHAMBER MV MISC: 1 refills | Status: AC

## 2022-12-03 MED ORDER — FLUTICASONE PROPIONATE 50 MCG/ACT NA SUSP: 2.0000 | Freq: Every day | NASAL | 0 refills | Status: DC

## 2022-12-03 MED ORDER — PROMETHAZINE-DM 6.25-15 MG/5ML PO SYRP: 5.0000 mL | ORAL_SOLUTION | Freq: Four times a day (QID) | ORAL | 0 refills | Status: DC | PRN

## 2022-12-03 MED ORDER — ALBUTEROL SULFATE HFA 108 (90 BASE) MCG/ACT IN AERS: 1.0000 | INHALATION_SPRAY | RESPIRATORY_TRACT | 0 refills | Status: DC | PRN

## 2022-12-03 NOTE — ED Triage Notes (Signed)
"  I have this Cough that started Saturday morning upon waking up and heading to a funeral". "Also, started with feeling like something is in my throat, throat hurts when I cough but doesn't otherwise". "Yesterday I felt pretty weak". No fever (known). No wheezing. No sob or respiratory distress. No runny nose.

## 2022-12-03 NOTE — ED Provider Notes (Signed)
HPI  SUBJECTIVE:  Emily Rose is a 37 y.o. female who presents with 2 days of cough productive of light green mucus, postnasal drip.  No fevers, body aches, headaches, nasal congestion, rhinorrhea, sinus pain or pressure, facial swelling, dental pain, sore throat, wheezing, chest pain, shortness of breath, nausea, vomiting, diarrhea, abdominal pain.  No dyspnea on exertion.  She reports burning chest pain the other day, but denies any other GERD symptoms.  No known COVID or flu exposure.  She is unable to sleep at night because of the cough.  No antibiotics in the past 3 months.  No antipyretic in the past 6 hours.  She tried Hall's cough drops, DayQuil and NyQuil with improvement in her symptoms.  Symptoms are worse with lying down and with smoking.  She has a past medical history of allergies that are bothering her now and is a smoker.  She has a history of GERD, but does not take any medications for it.  No history of hypertension, pulmonary disease.  LMP: Last week.  Denies possibility being pregnant.  PCP: [facility].    Past Medical History:  Diagnosis Date   Fibroid 02/19/2022   Menorrhagia with regular cycle 02/19/2022    Past Surgical History:  Procedure Laterality Date   DILATION AND CURETTAGE OF UTERUS      History reviewed. No pertinent family history.  Social History   Tobacco Use   Smoking status: Every Day    Current packs/day: 0.25    Types: Cigarettes   Smokeless tobacco: Never   Tobacco comments:    3 cigarettes / day   Vaping Use   Vaping status: Never Used  Substance Use Topics   Alcohol use: Yes    Alcohol/week: 2.0 standard drinks of alcohol    Types: 1 Cans of beer, 1 Standard drinks or equivalent per week    Comment: weekends usually   Drug use: No    No current facility-administered medications for this encounter.  Current Outpatient Medications:    albuterol (VENTOLIN HFA) 108 (90 Base) MCG/ACT inhaler, Inhale 1-2 puffs into the lungs  every 4 (four) hours as needed for wheezing or shortness of breath., Disp: 1 each, Rfl: 0   FEROSUL 325 (65 Fe) MG tablet, Take 325 mg by mouth daily with breakfast., Disp: , Rfl:    fluticasone (FLONASE) 50 MCG/ACT nasal spray, Place 2 sprays into both nostrils daily., Disp: 16 g, Rfl: 0   promethazine-dextromethorphan (PROMETHAZINE-DM) 6.25-15 MG/5ML syrup, Take 5 mLs by mouth 4 (four) times daily as needed for cough., Disp: 118 mL, Rfl: 0   Spacer/Aero-Holding Chambers (AEROCHAMBER MV) inhaler, Use as instructed, Disp: 1 each, Rfl: 1   ferrous sulfate 325 (65 FE) MG EC tablet, Take 1 tablet (325 mg total) by mouth 2 (two) times daily. (Patient taking differently: Take 325 mg by mouth daily.), Disp: 60 tablet, Rfl: 3   tranexamic acid (LYSTEDA) 650 MG TABS tablet, TAKE 2 TABLETS(1300 MG) BY MOUTH THREE TIMES DAILY. START AT THE BEGINNING OF PERIOD, Disp: 45 tablet, Rfl: 0  Allergies  Allergen Reactions   Iodine Swelling    "Seafood"    Shellfish Allergy Swelling    Itchy throat w/facial swelling... sts she recently had shrimp 06/2016 and had no reaction    Motrin [Ibuprofen] Hives     ROS  As noted in HPI.   Physical Exam  BP 128/81 (BP Location: Left Arm)   Pulse 71   Temp 98.7 F (37.1 C) (Oral)  Resp 18   Ht 5\' 2"  (1.575 m)   Wt 60.1 kg   LMP 11/27/2022 (Exact Date)   SpO2 99%   BMI 24.23 kg/m   Constitutional: Well developed, well nourished, no acute distress Eyes:  EOMI, conjunctiva normal bilaterally HENT: Normocephalic, atraumatic,mucus membranes moist.  Mild nasal congestion.  Erythematous, swollen turbinates.  No maxillary, frontal sinus tenderness.  No postnasal drip. Respiratory: Normal inspiratory effort, fair air movement, lungs clear bilaterally.  No anterior, lateral chest wall tenderness Cardiovascular: Normal rate, regular rhythm no murmurs rubs or gallops GI: nondistended skin: No rash, skin intact Musculoskeletal: no deformities Neurologic: Alert &  oriented x 3, no focal neuro deficits Psychiatric: Speech and behavior appropriate   ED Course   Medications - No data to display  Orders Placed This Encounter  Procedures   POC Covid + Flu A/B Antigen    Standing Status:   Standing    Number of Occurrences:   1    Results for orders placed or performed during the hospital encounter of 12/03/22 (from the past 24 hour(s))  POC Covid + Flu A/B Antigen     Status: Normal   Collection Time: 12/03/22  2:45 PM  Result Value Ref Range   Influenza A Antigen, POC Negative Negative   Influenza B Antigen, POC Negative Negative   Covid Antigen, POC Negative Negative   No results found.  ED Clinical Impression  1. Acute upper respiratory infection   2. Acute cough   3. Encounter for laboratory testing for COVID-19 virus      ED Assessment/Plan     COVID, flu negative.  Patient presents with a cough, most likely from an upper respiratory infection.  In the differential is GERD.  Home with Flonase, Mucinex, Promethazine DM, and albuterol inhaler with spacer every 4-6 hours as needed.  Pepcid if this does not alleviate her symptoms.  Follow-up with PCP.  Discussed labs, MDM, treatment plan, and plan for follow-up with patient. Discussed sn/sx that should prompt return to the ED. patient agrees with plan.   Meds ordered this encounter  Medications   albuterol (VENTOLIN HFA) 108 (90 Base) MCG/ACT inhaler    Sig: Inhale 1-2 puffs into the lungs every 4 (four) hours as needed for wheezing or shortness of breath.    Dispense:  1 each    Refill:  0   fluticasone (FLONASE) 50 MCG/ACT nasal spray    Sig: Place 2 sprays into both nostrils daily.    Dispense:  16 g    Refill:  0   promethazine-dextromethorphan (PROMETHAZINE-DM) 6.25-15 MG/5ML syrup    Sig: Take 5 mLs by mouth 4 (four) times daily as needed for cough.    Dispense:  118 mL    Refill:  0   Spacer/Aero-Holding Chambers (AEROCHAMBER MV) inhaler    Sig: Use as instructed     Dispense:  1 each    Refill:  1      *This clinic note was created using Dragon dictation software. Therefore, there may be occasional mistakes despite careful proofreading.  ?    Domenick Gong, MD 12/05/22 1123

## 2022-12-03 NOTE — Discharge Instructions (Signed)
Your COVID and flu testing were negative.  Use the Flonase, take plain Mucinex, Promethazine DM for cough.  2 puffs from your albuterol inhaler using your spacer every 4-6 hours as needed for cough.  You can try Pepcid if this does not help your symptoms.  Follow-up with your primary care provider if you do not improve.

## 2022-12-21 ENCOUNTER — Ambulatory Visit (INDEPENDENT_AMBULATORY_CARE_PROVIDER_SITE_OTHER): Payer: Medicaid Other | Admitting: Family Medicine

## 2022-12-21 ENCOUNTER — Encounter: Payer: Self-pay | Admitting: Family Medicine

## 2022-12-21 VITALS — BP 130/78 | HR 83 | Temp 98.2°F | Resp 18 | Ht 62.0 in | Wt 137.3 lb

## 2022-12-21 DIAGNOSIS — N920 Excessive and frequent menstruation with regular cycle: Secondary | ICD-10-CM | POA: Diagnosis not present

## 2022-12-21 DIAGNOSIS — L2084 Intrinsic (allergic) eczema: Secondary | ICD-10-CM

## 2022-12-21 DIAGNOSIS — D219 Benign neoplasm of connective and other soft tissue, unspecified: Secondary | ICD-10-CM | POA: Diagnosis not present

## 2022-12-21 MED ORDER — TRIAMCINOLONE ACETONIDE 0.1 % EX CREA
1.0000 | TOPICAL_CREAM | Freq: Two times a day (BID) | CUTANEOUS | 3 refills | Status: AC | PRN
Start: 2022-12-21 — End: ?

## 2022-12-21 NOTE — Progress Notes (Signed)
Established Patient Office Visit  Subjective   Patient ID: Emily Rose, female    DOB: 03-31-1985  Age: 37 y.o. MRN: [id number] he Chief Complaint  Patient presents with   Medical Management of Chronic Issues    Patient is here for a follow up for fibroids, she says she only has issues during her period, she would like to discuss surgery    HPI  Fibroids Pt with hx of fibroids and is interested in getting the fibroids removed. She has menorrhagia with her periods. She used Tranxemic acid for her pain but didn't help much.   Eczema She has hx of eczema as a child. She has rashes that are coming up on her wrist and behind her knees. She has used coconut oil.     Review of Systems  Skin:  Positive for itching and rash.  Endo/Heme/Allergies:        Heavy menses  All other systems reviewed and are negative.     Objective:     BP 130/78   Pulse 83   Temp 98.2 F (36.8 C) (Oral)   Resp 18   Ht 5\' 2"  (1.575 m)   Wt 137 lb 4.8 oz (62.3 kg)   LMP 11/27/2022 (Exact Date)   SpO2 100%   BMI 25.11 kg/m  BP Readings from Last 3 Encounters:  12/21/22 130/78  12/03/22 128/81  04/22/22 133/87      Physical Exam Vitals and nursing note reviewed.  Constitutional:      Appearance: Normal appearance. She is normal weight.  HENT:     Head: Normocephalic and atraumatic.     Right Ear: External ear normal.     Left Ear: External ear normal.     Nose: Nose normal.     Mouth/Throat:     Mouth: Mucous membranes are moist.     Pharynx: Oropharynx is clear.  Eyes:     Conjunctiva/sclera: Conjunctivae normal.     Pupils: Pupils are equal, round, and reactive to light.  Cardiovascular:     Rate and Rhythm: Normal rate.  Pulmonary:     Effort: Pulmonary effort is normal.  Skin:    Capillary Refill: Capillary refill takes less than 2 seconds.     Findings: Erythema and rash present.  Neurological:     General: No focal deficit present.     Mental Status: She is alert and  oriented to person, place, and time. Mental status is at baseline.  Psychiatric:        Mood and Affect: Mood normal.        Behavior: Behavior normal.        Thought Content: Thought content normal.        Judgment: Judgment normal.    No results found for any visits on 12/21/22.  Last CBC Lab Results  Component Value Date   WBC 7.1 02/02/2022   HGB 10.6 (L) 02/02/2022   HCT 32.3 (L) 02/02/2022   MCV 92.6 02/02/2022   MCH 30.4 02/02/2022   RDW 15.1 02/02/2022   PLT 293 02/02/2022   Last metabolic panel Lab Results  Component Value Date   GLUCOSE 89 02/02/2022   NA 138 02/02/2022   K 3.4 (L) 02/02/2022   CL 103 02/02/2022   CO2 28 02/02/2022   BUN 7 02/02/2022   CREATININE 0.77 02/02/2022   GFRNONAA >60 02/02/2022   CALCIUM 9.3 02/02/2022   PROT 7.1 02/02/2022   ALBUMIN 3.6 02/02/2022   BILITOT 0.3 02/02/2022  ALKPHOS 49 02/02/2022   AST 21 02/02/2022   ALT 19 02/02/2022   ANIONGAP 7 02/02/2022   Last lipids No results found for: "CHOL", "HDL", "LDLCALC", "LDLDIRECT", "TRIG", "CHOLHDL" Last hemoglobin A1c No results found for: "HGBA1C"    The ASCVD Risk score (Arnett DK, et al., 2019) failed to calculate for the following reasons:   The 2019 ASCVD risk score is only valid for ages 86 to 46    Assessment & Plan:   Problem List Items Addressed This Visit   None  Intrinsic eczema -     Triamcinolone Acetonide; Apply 1 Application topically 2 (two) times daily as needed.  Dispense: 30 g; Refill: 3  Fibroid -     CBC with Differential/Platelet  Menorrhagia with regular cycle -     CBC with Differential/Platelet   Sent triamcinolone cream BID prn for use for rash secondary to atopic dermatitis. Advised to keep skin moisturized. To follow up with Ob/GYN on fibroid treatment options I.e IUD vs surgery To check CBC today. No follow-ups on file.    Suzan Slick, MD

## 2022-12-22 ENCOUNTER — Other Ambulatory Visit: Payer: Self-pay | Admitting: Family Medicine

## 2022-12-22 DIAGNOSIS — N920 Excessive and frequent menstruation with regular cycle: Secondary | ICD-10-CM

## 2022-12-22 LAB — CBC WITH DIFFERENTIAL/PLATELET
Basophils Absolute: 0.1 10*3/uL (ref 0.0–0.2)
Basos: 2 %
EOS (ABSOLUTE): 0.2 10*3/uL (ref 0.0–0.4)
Eos: 4 %
Hematocrit: 30.7 % — ABNORMAL LOW (ref 34.0–46.6)
Hemoglobin: 9.4 g/dL — ABNORMAL LOW (ref 11.1–15.9)
Immature Grans (Abs): 0 10*3/uL (ref 0.0–0.1)
Immature Granulocytes: 0 %
Lymphocytes Absolute: 1.5 10*3/uL (ref 0.7–3.1)
Lymphs: 32 %
MCH: 26.6 pg (ref 26.6–33.0)
MCHC: 30.6 g/dL — ABNORMAL LOW (ref 31.5–35.7)
MCV: 87 fL (ref 79–97)
Monocytes Absolute: 0.5 10*3/uL (ref 0.1–0.9)
Monocytes: 11 %
Neutrophils Absolute: 2.4 10*3/uL (ref 1.4–7.0)
Neutrophils: 51 %
Platelets: 396 10*3/uL (ref 150–450)
RBC: 3.53 x10E6/uL — ABNORMAL LOW (ref 3.77–5.28)
RDW: 14.5 % (ref 11.7–15.4)
WBC: 4.6 10*3/uL (ref 3.4–10.8)

## 2022-12-22 MED ORDER — FERROUS SULFATE 325 (65 FE) MG PO TBEC: 325.0000 mg | DELAYED_RELEASE_TABLET | Freq: Every day | ORAL | 1 refills | Status: AC

## 2023-01-13 ENCOUNTER — Other Ambulatory Visit: Payer: Self-pay | Admitting: Family Medicine

## 2023-04-01 ENCOUNTER — Ambulatory Visit
Admission: EM | Admit: 2023-04-01 | Discharge: 2023-04-01 | Disposition: A | Discharge status: Home / Self Care | Attending: Physician Assistant | Admitting: Physician Assistant

## 2023-04-01 DIAGNOSIS — N390 Urinary tract infection, site not specified: Secondary | ICD-10-CM | POA: Insufficient documentation

## 2023-04-01 DIAGNOSIS — R350 Frequency of micturition: Secondary | ICD-10-CM | POA: Insufficient documentation

## 2023-04-01 DIAGNOSIS — R319 Hematuria, unspecified: Secondary | ICD-10-CM | POA: Insufficient documentation

## 2023-04-01 LAB — POCT URINALYSIS DIP (MANUAL ENTRY)
Bilirubin, UA: NEGATIVE
Glucose, UA: NEGATIVE mg/dL
Ketones, POC UA: NEGATIVE mg/dL
Nitrite, UA: NEGATIVE
Protein Ur, POC: >=300 mg/dL — AB
Spec Grav, UA: >=1.03 — AB (ref 1.010–1.025)
Urobilinogen, UA: 1 U/dL
pH, UA: 6 (ref 5.0–8.0)

## 2023-04-01 MED ORDER — NITROFURANTOIN MONOHYD MACRO 100 MG PO CAPS: 100.0000 mg | ORAL_CAPSULE | Freq: Two times a day (BID) | ORAL | 0 refills | Status: AC

## 2023-04-01 MED ORDER — PHENAZOPYRIDINE HCL 100 MG PO TABS: 100.0000 mg | ORAL_TABLET | Freq: Three times a day (TID) | ORAL | 0 refills | Status: DC | PRN

## 2023-04-01 NOTE — ED Triage Notes (Signed)
Pt states that she has some urinary urgency and pain with urination. X1 day

## 2023-04-01 NOTE — ED Provider Notes (Signed)
 [facility]    CSN: [id number] Arrival date & time: 04/01/23  1857      History   Chief Complaint Chief Complaint  Patient presents with   Urinary Urgency    Urinary urgency and pain with urination x1 day    HPI Emily Rose is a 38 y.o. female.   HPI  Patient presents today with concerns for urinary urgency and dysuria.  She reports that her symptoms started today. She reports burning after urination  She has not taken any medications for her symptoms     Past Medical History:  Diagnosis Date   Fibroid 02/19/2022   Menorrhagia with regular cycle 02/19/2022    Patient Active Problem List   Diagnosis Date Noted   Encounter for Department of Transportation (DOT) examination for driving license renewal 11/15/2534   Menorrhagia with regular cycle 02/19/2022   Fibroid 02/19/2022   Iron deficiency anemia due to chronic blood loss 02/19/2022    Past Surgical History:  Procedure Laterality Date   DILATION AND CURETTAGE OF UTERUS      OB History     Gravida  2   Para  0   Term  0   Preterm  0   AB  2   Living  0      SAB  0   IAB  2   Ectopic  0   Multiple  0   Live Births  0            Home Medications    Prior to Admission medications   Medication Sig Start Date End Date Taking? Authorizing Provider  albuterol (VENTOLIN HFA) 108 (90 Base) MCG/ACT inhaler INHALE 1-2 PUFFS INTO THE LUNGS EVERY 4 HOURS AS NEEDED FOR WHEEZING OR SHORTNESS OF BREATH. 01/14/23  Yes Rucker, Magdalen Spatz, MD  ferrous sulfate 325 (65 FE) MG EC tablet Take 1 tablet (325 mg total) by mouth daily with breakfast. 12/22/22  Yes Rucker, Magdalen Spatz, MD  nitrofurantoin, macrocrystal-monohydrate, (MACROBID) 100 MG capsule Take 1 capsule (100 mg total) by mouth 2 (two) times daily for 5 days. 04/01/23 04/06/23 Yes Codey Burling E, PA-C  phenazopyridine (PYRIDIUM) 100 MG tablet Take 1 tablet (100 mg total) by mouth 3 (three) times daily as needed for pain. 04/01/23  Yes  Tine Mabee E, PA-C  triamcinolone cream (KENALOG) 0.1 % Apply 1 Application topically 2 (two) times daily as needed. 12/21/22  Yes Rucker, Magdalen Spatz, MD  fluticasone (FLONASE) 50 MCG/ACT nasal spray Place 2 sprays into both nostrils daily. 12/03/22   Domenick Gong, MD  promethazine-dextromethorphan (PROMETHAZINE-DM) 6.25-15 MG/5ML syrup Take 5 mLs by mouth 4 (four) times daily as needed for cough. 12/03/22   Domenick Gong, MD  Spacer/Aero-Holding Chambers (AEROCHAMBER MV) inhaler Use as instructed 12/03/22   Domenick Gong, MD  tranexamic acid (LYSTEDA) 650 MG TABS tablet TAKE 2 TABLETS(1300 MG) BY MOUTH THREE TIMES DAILY. START AT THE BEGINNING OF PERIOD 10/05/22   Suzan Slick, MD    Family History History reviewed. No pertinent family history.  Social History Social History   Tobacco Use   Smoking status: Every Day    Current packs/day: 0.25    Types: Cigarettes, Cigars   Smokeless tobacco: Never   Tobacco comments:    3 cigarettes / day   Vaping Use   Vaping status: Never Used  Substance Use Topics   Alcohol use: Yes    Alcohol/week: 2.0 standard drinks of alcohol    Types: 1 Cans of  beer, 1 Standard drinks or equivalent per week    Comment: weekends usually   Drug use: No     Allergies   Iodine, Shellfish allergy, and Motrin [ibuprofen]   Review of Systems Review of Systems  Constitutional:  Negative for chills and fever.  Gastrointestinal:  Positive for abdominal pain (suprapubic pain).  Genitourinary:  Positive for dysuria, flank pain (earlier today but resolved), frequency and urgency. Negative for vaginal bleeding, vaginal discharge and vaginal pain.     Physical Exam Triage Vital Signs ED Triage Vitals  Encounter Vitals Group     BP 04/01/23 1920 (!) 142/91     Systolic BP Percentile --      Diastolic BP Percentile --      Pulse Rate 04/01/23 1920 81     Resp 04/01/23 1920 19     Temp 04/01/23 1920 98.2 F (36.8 C)     Temp Source  04/01/23 1920 Oral     SpO2 04/01/23 1920 98 %     Weight 04/01/23 1918 136 lb (61.7 kg)     Height 04/01/23 1918 5\' 2"  (1.575 m)     Head Circumference --      Peak Flow --      Pain Score 04/01/23 1918 8     Pain Loc --      Pain Education --      Exclude from Growth Chart --    No data found.  Updated Vital Signs BP (!) 142/91 (BP Location: Right Arm)   Pulse 81   Temp 98.2 F (36.8 C) (Oral)   Resp 19   Ht 5\' 2"  (1.575 m)   Wt 136 lb (61.7 kg)   LMP 03/29/2023   SpO2 98%   BMI 24.87 kg/m   Visual Acuity Right Eye Distance:   Left Eye Distance:   Bilateral Distance:    Right Eye Near:   Left Eye Near:    Bilateral Near:     Physical Exam Vitals reviewed.  Constitutional:      General: She is awake.     Appearance: Normal appearance. She is well-developed and well-groomed.  HENT:     Head: Normocephalic and atraumatic.  Eyes:     General: Lids are normal. Gaze aligned appropriately.     Extraocular Movements: Extraocular movements intact.     Conjunctiva/sclera: Conjunctivae normal.  Pulmonary:     Effort: Pulmonary effort is normal.  Neurological:     General: No focal deficit present.     Mental Status: She is alert and oriented to person, place, and time.     GCS: GCS eye subscore is 4. GCS verbal subscore is 5. GCS motor subscore is 6.     Cranial Nerves: No cranial nerve deficit, dysarthria or facial asymmetry.  Psychiatric:        Attention and Perception: Attention and perception normal.        Mood and Affect: Mood and affect normal.        Speech: Speech normal.        Behavior: Behavior normal. Behavior is cooperative.      UC Treatments / Results  Labs (all labs ordered are listed, but only abnormal results are displayed) Labs Reviewed  POCT URINALYSIS DIP (MANUAL ENTRY) - Abnormal; Notable for the following components:      Result Value   Spec Grav, UA >=1.030 (*)    Blood, UA moderate (*)    Protein Ur, POC >=300 (*)    Leukocytes,  UA Small (1+) (*)    All other components within normal limits  URINE CULTURE  CERVICOVAGINAL ANCILLARY ONLY    EKG   Radiology No results found.  Procedures Procedures (including critical care time)  Medications Ordered in UC Medications - No data to display  Initial Impression / Assessment and Plan / UC Course  I have reviewed the triage vital signs and the nursing notes.  Pertinent labs & imaging results that were available during my care of the patient were reviewed by me and considered in my medical decision making (see chart for details).      Final Clinical Impressions(s) / UC Diagnoses   Final diagnoses:  Urinary frequency  Urinary tract infection with hematuria, site unspecified   Patient presents today with concerns for UTI.  She reports that she is having increased urinary frequency, urinary urgency as well as dysuria.  She reports that symptoms started yesterday and she has not taken anything for her symptoms.  Urine dip was positive for leukocytes, blood, protein.  Will send off urine culture for definitive rule out.  Cervicovaginal swab also obtained.  Patient is amenable to testing for gonorrhea, chlamydia, BV, trichomonas, yeast.  Results to dictate further management.  Will start Macrobid p.o. twice daily x 5 days for suspected UTI.  Results of urine culture to dictate further management or adjustments to medication regimen.  Will also send in Pyridium to assist with discomfort.  ED and return precautions were reviewed and provided in after visit summary.  Follow-up as needed for progressing or persistent symptoms    Discharge Instructions      Based on your symptoms and results of the urinalysis I believe you have a UTI I recommend the following:  I have sent in a script for Macrobid to be taken by mouth twice per day for 5 days   Please finish the entire course of the antibiotic even if you are feeling better before it is completed.  I have also sent in  a script for Pyridium to assist with urinary discomfort. This can turn the urine orange so please do not be alarmed if this occurs  Stay well hydrated (at least 75 oz of water per day) and avoid holding your urine If you have any of the following please let us know or go to ED : persistent symptoms, fever, trouble urinating or inability to urinate, confusion, flank pain.   We will keep you updated on the results of your cervicovaginal swab and urine culture.  If medications are indicated by these results they will be sent to the pharmacy on file.  We will contact you if any changes need to be made to your antibiotic regimen for the UTI.     ED Prescriptions     Medication Sig Dispense Auth. Provider   nitrofurantoin, macrocrystal-monohydrate, (MACROBID) 100 MG capsule Take 1 capsule (100 mg total) by mouth 2 (two) times daily for 5 days. 10 capsule Morgyn Marut E, PA-C   phenazopyridine (PYRIDIUM) 100 MG tablet Take 1 tablet (100 mg total) by mouth 3 (three) times daily as needed for pain. 10 tablet Miriam Liles E, PA-C      PDMP not reviewed this encounter.   Roselind Messier 04/01/23 2029

## 2023-04-01 NOTE — Discharge Instructions (Addendum)
 Based on your symptoms and results of the urinalysis I believe you have a UTI I recommend the following:  I have sent in a script for Macrobid to be taken by mouth twice per day for 5 days   Please finish the entire course of the antibiotic even if you are feeling better before it is completed.  I have also sent in a script for Pyridium to assist with urinary discomfort. This can turn the urine orange so please do not be alarmed if this occurs  Stay well hydrated (at least 75 oz of water per day) and avoid holding your urine If you have any of the following please let us know or go to ED : persistent symptoms, fever, trouble urinating or inability to urinate, confusion, flank pain.   We will keep you updated on the results of your cervicovaginal swab and urine culture.  If medications are indicated by these results they will be sent to the pharmacy on file.  We will contact you if any changes need to be made to your antibiotic regimen for the UTI.

## 2023-04-02 ENCOUNTER — Telehealth (HOSPITAL_COMMUNITY): Payer: Self-pay

## 2023-04-02 LAB — CERVICOVAGINAL ANCILLARY ONLY
Bacterial Vaginitis (gardnerella): POSITIVE — AB
Candida Glabrata: NEGATIVE
Candida Vaginitis: NEGATIVE
Chlamydia: NEGATIVE
Comment: NEGATIVE
Comment: NEGATIVE
Comment: NEGATIVE
Comment: NEGATIVE
Comment: NEGATIVE
Comment: NORMAL
Neisseria Gonorrhea: NEGATIVE
Trichomonas: NEGATIVE

## 2023-04-02 MED ORDER — METRONIDAZOLE 500 MG PO TABS: 500.0000 mg | ORAL_TABLET | Freq: Two times a day (BID) | ORAL | 0 refills | Status: AC

## 2023-04-02 NOTE — Telephone Encounter (Signed)
 Per protocol, pt requires tx with metronidazole. Rx sent to pharmacy on file.

## 2023-04-03 LAB — URINE CULTURE: Culture: 60000 — AB

## 2023-06-09 ENCOUNTER — Ambulatory Visit: Admitting: Obstetrics and Gynecology

## 2023-08-12 ENCOUNTER — Encounter: Payer: Self-pay | Admitting: Obstetrics and Gynecology

## 2023-08-12 ENCOUNTER — Other Ambulatory Visit: Payer: Self-pay

## 2023-08-12 ENCOUNTER — Ambulatory Visit: Admitting: Obstetrics and Gynecology

## 2023-08-12 VITALS — BP 135/89 | HR 90 | Wt 125.1 lb

## 2023-08-12 DIAGNOSIS — D219 Benign neoplasm of connective and other soft tissue, unspecified: Secondary | ICD-10-CM | POA: Diagnosis not present

## 2023-08-12 DIAGNOSIS — N946 Dysmenorrhea, unspecified: Secondary | ICD-10-CM | POA: Diagnosis not present

## 2023-08-12 DIAGNOSIS — D500 Iron deficiency anemia secondary to blood loss (chronic): Secondary | ICD-10-CM

## 2023-08-12 DIAGNOSIS — N920 Excessive and frequent menstruation with regular cycle: Secondary | ICD-10-CM

## 2023-08-12 NOTE — Progress Notes (Signed)
 Obstetrics and Gynecology New Patient Evaluation  Appointment Date: 08/12/2023  OBGYN Clinic: [facility]   Primary Care Provider: Colette Torrence GRADE  Referring Provider: Colette Torrence GRADE, MD  Chief Complaint: fibroids consult   History of Present Illness: Emily Rose is a 38 y.o.  G2P0020 (LMP: 7/11), seen for the above chief complaint. Her past medical history is significant for anemia, fibroids, potential sleep apnea, tobacco abuse, NSAID allergy  Patient has been seen by Dr. Fredirick and with last visit 03/2022. At that time, Lysteda  was prescribed and follow up in 3 months along with an updated u/s ordered and sleep study. Sleep study with recommendation to avoid sleeping supine and pt didn't follow up or have updated u/s.  Currently, patient states that her periods are qmonth, regular, 8 days of heavy and painful periods and that she did the Lysteda , last dose 2-3 months ago, but it made her periods worse; she states she used it for about 4 cycles. She states she can't use NSAIDs but pamprin does help.   Review of Systems: as per HPI  Patient Active Problem List   Diagnosis Date Noted   Dysmenorrhea 08/12/2023   Menorrhagia with regular cycle 02/19/2022   Fibroid 02/19/2022   Iron deficiency anemia due to chronic blood loss 02/19/2022    Past Medical History:  Past Medical History:  Diagnosis Date   Fibroid 02/19/2022   Menorrhagia with regular cycle 02/19/2022   Past Surgical History:  Past Surgical History:  Procedure Laterality Date   DILATION AND CURETTAGE OF UTERUS     Past Obstetrical History:  OB History  Gravida Para Term Preterm AB Living  2 0 0 0 2 0  SAB IAB Ectopic Multiple Live Births  0 2 0 0 0    # Outcome Date GA Lbr Len/2nd Weight Sex Type Anes PTL Lv  2 IAB 2007          1 IAB 2004           Past Gynecological History: As per HPI. History of Pap Smear(s): Yes.   Last pap 03/2022, which was negative and HPV  negative She is currently using no method for contraception.   Social History:  Social History   Socioeconomic History   Marital status: Single    Spouse name: Not on file   Number of children: Not on file   Years of education: Not on file   Highest education level: Not on file  Occupational History   Not on file  Tobacco Use   Smoking status: Every Day    Current packs/day: 0.25    Types: Cigarettes, Cigars   Smokeless tobacco: Never   Tobacco comments:    3 cigarettes / day   Vaping Use   Vaping status: Never Used  Substance and Sexual Activity   Alcohol use: Yes    Alcohol/week: 2.0 standard drinks of alcohol    Types: 1 Cans of beer, 1 Standard drinks or equivalent per week    Comment: weekends usually   Drug use: No   Sexual activity: Not Currently    Birth control/protection: None  Other Topics Concern   Not on file  Social History Narrative   Works for a [employer] engineer   Social Drivers of Health   Financial Resource Strain: Not on file  Food Insecurity: No Food Insecurity (03/26/2022)   Hunger Vital Sign    Worried About Running Out of Food in the Last Year: Never true  Ran Out of Food in the Last Year: Never true  Transportation Needs: No Transportation Needs (03/26/2022)   PRAPARE - Administrator, Civil Service (Medical): No    Lack of Transportation (Non-Medical): No  Physical Activity: Not on file  Stress: Not on file  Social Connections: Not on file  Intimate Partner Violence: Not on file   Family History: No family history on file.  Medications Aysiah Kilty had no medications administered during this visit. Current Outpatient Medications  Medication Sig Dispense Refill   albuterol  (VENTOLIN  HFA) 108 (90 Base) MCG/ACT inhaler INHALE 1-2 PUFFS INTO THE LUNGS EVERY 4 HOURS AS NEEDED FOR WHEEZING OR SHORTNESS OF BREATH. 18 each 0   ferrous sulfate  325 (65 FE) MG EC tablet Take 1 tablet (325 mg total) by mouth daily with breakfast. 90 tablet  1   fluticasone  (FLONASE ) 50 MCG/ACT nasal spray Place 2 sprays into both nostrils daily. 16 g 0   phenazopyridine  (PYRIDIUM ) 100 MG tablet Take 1 tablet (100 mg total) by mouth 3 (three) times daily as needed for pain. 10 tablet 0   promethazine -dextromethorphan (PROMETHAZINE -DM) 6.25-15 MG/5ML syrup Take 5 mLs by mouth 4 (four) times daily as needed for cough. 118 mL 0   Spacer/Aero-Holding Chambers (AEROCHAMBER MV) inhaler Use as instructed 1 each 1   triamcinolone  cream (KENALOG ) 0.1 % Apply 1 Application topically 2 (two) times daily as needed. 30 g 3   tranexamic acid  (LYSTEDA ) 650 MG TABS tablet TAKE 2 TABLETS(1300 MG) BY MOUTH THREE TIMES DAILY. START AT THE BEGINNING OF PERIOD (Patient not taking: Reported on 08/12/2023) 45 tablet 0   No current facility-administered medications for this visit.   Allergies Iodine, Shellfish allergy, and Motrin [ibuprofen]  Physical Exam:  BP 135/89   Pulse 90   Wt 125 lb 1.6 oz (56.7 kg)   BMI 22.88 kg/m  Body mass index is 22.88 kg/m. General appearance: Well nourished, well developed female in no acute distress.  Neck:  Supple, normal appearance, and no thyromegaly  Respiratory:  Normal respiratory effort Abdomen: soft, nttp Neuro/Psych:  Normal mood and affect.  Skin:  Warm and dry.  Lymphatic:  No inguinal lymphadenopathy.   Laboratory: no new labs    Latest Ref Rng & Units 08/12/2023   11:57 AM 12/21/2022    2:39 PM 02/02/2022    1:24 PM  CBC  WBC 3.4 - 10.8 x10E3/uL 4.0  4.6  7.1   Hemoglobin 11.1 - 15.9 g/dL 7.4  9.4  89.3   Hematocrit 34.0 - 46.6 % 25.8  30.7  32.3   Platelets 150 - 450 x10E3/uL 650  396  293     Radiology: no new imaging Narrative & Impression  CLINICAL DATA:  Pelvic pain   EXAM: TRANSABDOMINAL AND TRANSVAGINAL ULTRASOUND OF PELVIS   DOPPLER ULTRASOUND OF OVARIES   TECHNIQUE: Both transabdominal and transvaginal ultrasound examinations of the pelvis were performed. Transabdominal technique was  performed for global imaging of the pelvis including uterus, ovaries, adnexal regions, and pelvic cul-de-sac.   It was necessary to proceed with endovaginal exam following the transabdominal exam to visualize the ovaries. Color and duplex Doppler ultrasound was utilized to evaluate blood flow to the ovaries.   COMPARISON:  03/22/2021 and older   FINDINGS: Uterus   Measurements: 9.9 x 6.3 x 6.7 = volume: 216.5 mL. Heterogeneous myometrium with focal fibroids. Example anterior midbody measuring 3.7 x 2.9 x 3.4 cm. Previously this measured 3.9 x 3.2 x 4.1 cm. Posterior fundal  area measures 3.0 x 2.2 x 2.9 cm. There is also a submucosal fibroid centrally measuring 18 x 11 x 17 mm with some distortion of the underlying endometrium.   Endometrium   Thickness: 24. Focal mass lesion consistent with a subendometrial fibroid measuring 18 x 11 x 17 mm with distortion.   Right ovary   Measurements: 3.4 x 1.2 x 1.0 = volume: 2.1 mL. Normal appearance/no adnexal mass.   Left ovary   Measurements: 2.9 x 1.5 x 2.5 = volume: 5.2 mL. Normal appearance/no adnexal mass.   Pulsed Doppler evaluation of both ovaries demonstrates normal low-resistance arterial and venous waveforms.   Other findings   Small amount of free fluid in the pelvis.   IMPRESSION: Multiple uterine fibroids. There is 1 fibroid as well obscuring the thickened endometrium, submucosal component. Small amount of free fluid in the pelvis. If there is further concern of the distribution of fibroids in the associated endometrium, female pelvic MRI could be performed as clinically indicated and when appropriate     Electronically Signed   By: Ranell Bring M.D.   On: 02/02/2022 14:53    Assessment: patient stable  Plan:  1. Menorrhagia with regular cycle (Primary) D/w her that I agree that she needs an updated pelvic u/s. Tentative options d/w her; she states that she wants to leave potential for childbearing  open. I d/w her re: Sonata w/ or without hysteroscopic myomectomy, myomectomy. Medication wise, she's not a candidate for anything with estrogen and I'd be wary for IUD placement with potential submucosal fibroid present. Follow up labs and u/s and based on u/s, can discuss her with Sonata surgeons in the practice and see if she's a candidate.  - US  PELVIC COMPLETE WITH TRANSVAGINAL; Future - Anemia Profile B  2. Dysmenorrhea - US  PELVIC COMPLETE WITH TRANSVAGINAL; Future - Anemia Profile B  3. Fibroid  4. History of anemia - Anemia Profile B  RTC: follow up with patient after early cycle u/s.   No follow-ups on file.  Future Appointments  Date Time Provider Department Center  09/03/2023  9:00 AM [facility]  1 [facility]  [facility]    Bebe Furry, Mickey MD Attending [facility])

## 2023-08-13 LAB — ANEMIA PROFILE B
Basophils Absolute: 0.1 x10E3/uL (ref 0.0–0.2)
Basos: 2 %
EOS (ABSOLUTE): 0.2 x10E3/uL (ref 0.0–0.4)
Eos: 5 %
Ferritin: 7 ng/mL — ABNORMAL LOW (ref 15–150)
Folate: 11.2 ng/mL (ref >3.0)
Hematocrit: 25.8 % — ABNORMAL LOW (ref 34.0–46.6)
Hemoglobin: 7.4 g/dL — ABNORMAL LOW (ref 11.1–15.9)
Immature Grans (Abs): 0 x10E3/uL (ref 0.0–0.1)
Immature Granulocytes: 0 %
Iron Saturation: 79 % (ref 15–55)
Iron: 342 ug/dL (ref 27–159)
Lymphocytes Absolute: 1.1 x10E3/uL (ref 0.7–3.1)
Lymphs: 28 %
MCH: 23.3 pg — ABNORMAL LOW (ref 26.6–33.0)
MCHC: 28.7 g/dL — ABNORMAL LOW (ref 31.5–35.7)
MCV: 81 fL (ref 79–97)
Monocytes Absolute: 0.5 x10E3/uL (ref 0.1–0.9)
Monocytes: 11 %
Neutrophils Absolute: 2.2 x10E3/uL (ref 1.4–7.0)
Neutrophils: 54 %
Platelets: 650 x10E3/uL — ABNORMAL HIGH (ref 150–450)
RBC: 3.17 x10E6/uL — ABNORMAL LOW (ref 3.77–5.28)
RDW: 16.6 % — ABNORMAL HIGH (ref 11.7–15.4)
Retic Ct Pct: 1.8 % (ref 0.6–2.6)
Total Iron Binding Capacity: 435 ug/dL (ref 250–450)
UIBC: 93 ug/dL — ABNORMAL LOW (ref 131–425)
Vitamin B-12: 292 pg/mL (ref 232–1245)
WBC: 4 x10E3/uL (ref 3.4–10.8)

## 2023-08-15 ENCOUNTER — Ambulatory Visit: Payer: Self-pay | Admitting: Obstetrics and Gynecology

## 2023-08-15 DIAGNOSIS — D649 Anemia, unspecified: Secondary | ICD-10-CM

## 2023-08-27 ENCOUNTER — Inpatient Hospital Stay

## 2023-08-27 ENCOUNTER — Encounter: Payer: Self-pay | Admitting: Hematology and Oncology

## 2023-08-27 ENCOUNTER — Telehealth: Payer: Self-pay

## 2023-08-27 ENCOUNTER — Inpatient Hospital Stay: Attending: Hematology and Oncology | Admitting: Hematology and Oncology

## 2023-08-27 VITALS — BP 139/104 | HR 95 | Temp 98.9°F | Resp 18 | Ht 62.0 in | Wt 129.4 lb

## 2023-08-27 DIAGNOSIS — N920 Excessive and frequent menstruation with regular cycle: Secondary | ICD-10-CM | POA: Insufficient documentation

## 2023-08-27 DIAGNOSIS — Z888 Allergy status to other drugs, medicaments and biological substances status: Secondary | ICD-10-CM | POA: Insufficient documentation

## 2023-08-27 DIAGNOSIS — Z9889 Other specified postprocedural states: Secondary | ICD-10-CM | POA: Diagnosis not present

## 2023-08-27 DIAGNOSIS — D259 Leiomyoma of uterus, unspecified: Secondary | ICD-10-CM | POA: Insufficient documentation

## 2023-08-27 DIAGNOSIS — F17210 Nicotine dependence, cigarettes, uncomplicated: Secondary | ICD-10-CM | POA: Insufficient documentation

## 2023-08-27 DIAGNOSIS — Z886 Allergy status to analgesic agent status: Secondary | ICD-10-CM | POA: Insufficient documentation

## 2023-08-27 DIAGNOSIS — D75838 Other thrombocytosis: Secondary | ICD-10-CM | POA: Insufficient documentation

## 2023-08-27 DIAGNOSIS — D500 Iron deficiency anemia secondary to blood loss (chronic): Secondary | ICD-10-CM | POA: Diagnosis present

## 2023-08-27 DIAGNOSIS — L309 Dermatitis, unspecified: Secondary | ICD-10-CM | POA: Insufficient documentation

## 2023-08-27 NOTE — Assessment & Plan Note (Signed)
 She has documented uterine fibroid from prior imaging She is scheduled for another repeat ultrasound soon We discussed options Ultimately, she will discuss plan with her referring physician

## 2023-08-27 NOTE — Assessment & Plan Note (Signed)
 I anticipate improvement with adequate IV iron replacement

## 2023-08-27 NOTE — Telephone Encounter (Signed)
 Dr. Lonn, patient will be scheduled as soon as possible.  Auth Submission: NO AUTH NEEDED Site of care: Site of care: [facility] Payer: [place] Medicaid [employer] Medication & CPT/J Code(s) submitted: Feraheme (ferumoxytol) [id number] Diagnosis Code:  Route of submission (phone, fax, portal): portal Phone # Fax # Auth type: Buy/Bill PB Units/visits requested: 510mg  x 2 doses Reference number:  Approval from: 08/27/23 to 12/27/23

## 2023-08-27 NOTE — Progress Notes (Signed)
 [facility] CONSULT NOTE  Patient Care Team: Colette Torrence GRADE, MD as PCP - General (Family Medicine)  ASSESSMENT & PLAN:  Iron deficiency anemia due to chronic blood loss The most likely cause of her anemia is due to chronic blood loss/malabsorption syndrome. We discussed some of the risks, benefits, and alternatives of intravenous iron infusions. The patient is symptomatic from anemia and the iron level is critically low. She tolerated oral iron supplement poorly and desires to achieved higher levels of iron faster for adequate hematopoesis. Some of the side-effects to be expected including risks of infusion reactions, phlebitis, headaches, nausea and fatigue.  The patient is willing to proceed. Patient education material was dispensed.  Goal is to keep ferritin level greater than 50 and resolution of anemia I recommend vitamin B12 supplement also to assist in erythropoiesis I plan to see her again in 3 months for further follow-up   Menorrhagia with regular cycle She has documented uterine fibroid from prior imaging She is scheduled for another repeat ultrasound soon We discussed options Ultimately, she will discuss plan with her referring physician  Reactive thrombocytosis I anticipate improvement with adequate IV iron replacement Orders Placed This Encounter  Procedures   Ferritin    Standing Status:   Future    Expiration Date:   08/26/2024   Iron and Iron Binding Capacity ([facility] only)    Standing Status:   Future    Expiration Date:   08/26/2024   CBC with Differential (Cancer Center Only)    Standing Status:   Future    Expiration Date:   08/26/2024   Sedimentation rate    Standing Status:   Future    Expiration Date:   08/26/2024   TSH    Standing Status:   Future    Expiration Date:   08/26/2024    All questions were answered. The patient knows to call the clinic with any problems, questions or concerns.  The total time spent in the appointment was 60  minutes encounter with patients including review of chart and various tests results, discussions about plan of care and coordination of care plan  Almarie Bedford, MD 8/8/202512:35 PM   CHIEF COMPLAINTS/PURPOSE OF CONSULTATION:  Anemia  HISTORY OF PRESENTING ILLNESS:  Emily Rose 38 y.o. female is here because of anemia  She was found to have abnormal CBC from recent blood count monitoring I have the opportunity to review his CBC dated back to February 2023 On  March 11, 2021, hemoglobin 11.1 On March 22, 2021, hemoglobin is 12.3 On October 15, 2021, hemoglobin 10.9 On February 02, 2022, hemoglobin 10.6 On December 21, 2022, hemoglobin 9.4 On August 12, 2023, hemoglobin 7.4 with elevated platelet count of 650 Iron studies confirm iron deficiency anemia with ferritin of 7, normal folate and vitamin B12 of 292 She denies recent chest pain on exertion, shortness of breath on minimal exertion, pre-syncopal episodes, or palpitations.  She complained of excessive fatigue She had not noticed any recent bleeding such as epistaxis, hematuria or hematochezia The patient denies over the counter NSAID ingestion. She is not on antiplatelets agents.  She had no prior history or diagnosis of cancer. Her age appropriate screening programs are up-to-date. She denies any pica and eats a variety of diet. She donated blood several years ago but none over the past 5 years.  She has never received blood transfusion The patient was prescribed oral iron supplements and she takes inconsistently half an hour after breakfast  MEDICAL HISTORY:  Past Medical History:  Diagnosis Date   Anemia    Fibroid 02/19/2022   Menorrhagia with regular cycle 02/19/2022    SURGICAL HISTORY: Past Surgical History:  Procedure Laterality Date   DILATION AND CURETTAGE OF UTERUS      SOCIAL HISTORY: Social History   Socioeconomic History   Marital status: Single    Spouse name: Not on file   Number of children: Not  on file   Years of education: Not on file   Highest education level: Not on file  Occupational History   Not on file  Tobacco Use   Smoking status: Every Day    Current packs/day: 0.25    Types: Cigarettes, Cigars   Smokeless tobacco: Never   Tobacco comments:    3 cigarettes / day   Vaping Use   Vaping status: Never Used  Substance and Sexual Activity   Alcohol use: Yes    Alcohol/week: 2.0 standard drinks of alcohol    Types: 1 Cans of beer, 1 Standard drinks or equivalent per week    Comment: weekends usually   Drug use: No   Sexual activity: Not Currently    Birth control/protection: None  Other Topics Concern   Not on file  Social History Narrative   Works for a Materials engineer   Social Drivers of Health   Financial Resource Strain: Not on file  Food Insecurity: No Food Insecurity (03/26/2022)   Hunger Vital Sign    Worried About Running Out of Food in the Last Year: Never true    Ran Out of Food in the Last Year: Never true  Transportation Needs: No Transportation Needs (03/26/2022)   PRAPARE - Administrator, Civil Service (Medical): No    Lack of Transportation (Non-Medical): No  Physical Activity: Not on file  Stress: Not on file  Social Connections: Not on file  Intimate Partner Violence: Not on file    FAMILY HISTORY: History reviewed. No pertinent family history.  ALLERGIES:  is allergic to iodine, shellfish allergy, and motrin [ibuprofen].  MEDICATIONS:  Current Outpatient Medications  Medication Sig Dispense Refill   albuterol  (VENTOLIN  HFA) 108 (90 Base) MCG/ACT inhaler INHALE 1-2 PUFFS INTO THE LUNGS EVERY 4 HOURS AS NEEDED FOR WHEEZING OR SHORTNESS OF BREATH. 18 each 0   ferrous sulfate  325 (65 FE) MG EC tablet Take 1 tablet (325 mg total) by mouth daily with breakfast. 90 tablet 1   fluticasone  (FLONASE ) 50 MCG/ACT nasal spray Place 2 sprays into both nostrils daily. 16 g 0   Spacer/Aero-Holding Chambers (AEROCHAMBER MV) inhaler Use as  instructed 1 each 1   triamcinolone  cream (KENALOG ) 0.1 % Apply 1 Application topically 2 (two) times daily as needed. 30 g 3   No current facility-administered medications for this visit.    REVIEW OF SYSTEMS:   Constitutional: Denies fevers, chills or abnormal night sweats Eyes: Denies blurriness of vision, double vision or watery eyes Ears, nose, mouth, throat, and face: Denies mucositis or sore throat Respiratory: Denies cough, dyspnea or wheezes Cardiovascular: Denies palpitation, chest discomfort or lower extremity swelling Gastrointestinal:  Denies nausea, heartburn or change in bowel habits Skin: Denies abnormal skin rashes Lymphatics: Denies new lymphadenopathy or easy bruising Neurological:Denies numbness, tingling or new weaknesses Behavioral/Psych: Mood is stable, no new changes  All other systems were reviewed with the patient and are negative.  PHYSICAL EXAMINATION: ECOG PERFORMANCE STATUS: 1 - Symptomatic but completely ambulatory  Vitals:   08/27/23 1152  BP: (!) 139/104  Pulse: 95  Resp: 18  Temp: 98.9 F (37.2 C)  SpO2: 100%   Filed Weights   08/27/23 1152  Weight: 129 lb 6.4 oz (58.7 kg)    GENERAL:alert, no distress and comfortable SKIN: skin color, texture, turgor are normal, no rashes or significant lesions EYES: normal, conjunctiva are pink and non-injected, sclera clear OROPHARYNX:no exudate, no erythema and lips, buccal mucosa, and tongue normal  NECK: supple, thyroid normal size, non-tender, without nodularity LYMPH:  no palpable lymphadenopathy in the cervical, axillary or inguinal LUNGS: clear to auscultation and percussion with normal breathing effort HEART: regular rate & rhythm and no murmurs and no lower extremity edema ABDOMEN:abdomen soft, non-tender and normal bowel sounds Musculoskeletal:no cyanosis of digits and no clubbing  PSYCH: alert & oriented x 3 with fluent speech NEURO: no focal motor/sensory deficits  RADIOGRAPHIC STUDIES:  I reviewed ultrasound report from January 2024

## 2023-08-27 NOTE — Assessment & Plan Note (Signed)
 The most likely cause of her anemia is due to chronic blood loss/malabsorption syndrome. We discussed some of the risks, benefits, and alternatives of intravenous iron infusions. The patient is symptomatic from anemia and the iron level is critically low. She tolerated oral iron supplement poorly and desires to achieved higher levels of iron faster for adequate hematopoesis. Some of the side-effects to be expected including risks of infusion reactions, phlebitis, headaches, nausea and fatigue.  The patient is willing to proceed. Patient education material was dispensed.  Goal is to keep ferritin level greater than 50 and resolution of anemia I recommend vitamin B12 supplement also to assist in erythropoiesis I plan to see her again in 3 months for further follow-up

## 2023-09-02 ENCOUNTER — Ambulatory Visit (INDEPENDENT_AMBULATORY_CARE_PROVIDER_SITE_OTHER)

## 2023-09-02 VITALS — BP 122/80 | HR 84 | Temp 98.2°F | Resp 14 | Ht 62.0 in | Wt 125.4 lb

## 2023-09-02 DIAGNOSIS — D500 Iron deficiency anemia secondary to blood loss (chronic): Secondary | ICD-10-CM

## 2023-09-02 DIAGNOSIS — N920 Excessive and frequent menstruation with regular cycle: Secondary | ICD-10-CM

## 2023-09-02 DIAGNOSIS — D259 Leiomyoma of uterus, unspecified: Secondary | ICD-10-CM | POA: Diagnosis not present

## 2023-09-02 MED ORDER — ACETAMINOPHEN 325 MG PO TABS
650.0000 mg | ORAL_TABLET | Freq: Once | ORAL | Status: AC
  Administered 2023-09-02: 650 mg via ORAL
  Filled 2023-09-02: qty 2

## 2023-09-02 MED ORDER — SODIUM CHLORIDE 0.9 % IV SOLN
510.0000 mg | Freq: Once | INTRAVENOUS | Status: AC
  Administered 2023-09-02: 510 mg via INTRAVENOUS
  Filled 2023-09-02: qty 17

## 2023-09-02 MED ORDER — DIPHENHYDRAMINE HCL 25 MG PO CAPS
25.0000 mg | ORAL_CAPSULE | Freq: Once | ORAL | Status: AC
  Administered 2023-09-02: 25 mg via ORAL
  Filled 2023-09-02: qty 1

## 2023-09-02 NOTE — Progress Notes (Signed)
 Diagnosis: Iron Deficiency Anemia  Provider:  Mannam, Praveen MD  Procedure: IV Infusion  IV Type: Peripheral, IV Location: L Antecubital  Feraheme (Ferumoxytol), Dose: 510 mg  Infusion Start Time: 1607  Infusion Stop Time: 1625  Post Infusion IV Care: Observation period completed and Peripheral IV Discontinued  Discharge: Condition: Stable, Destination: Home . AVS Provided  Performed by:  Sunita Ranabhat, RN

## 2023-09-03 ENCOUNTER — Ambulatory Visit (HOSPITAL_BASED_OUTPATIENT_CLINIC_OR_DEPARTMENT_OTHER): Admission: RE | Admit: 2023-09-03 | Source: Ambulatory Visit

## 2023-09-04 ENCOUNTER — Emergency Department (HOSPITAL_COMMUNITY)
Admission: EM | Admit: 2023-09-04 | Discharge: 2023-09-05 | Disposition: A | Discharge status: Home / Self Care | Attending: Emergency Medicine | Admitting: Emergency Medicine

## 2023-09-04 ENCOUNTER — Other Ambulatory Visit: Payer: Self-pay

## 2023-09-04 ENCOUNTER — Encounter (HOSPITAL_COMMUNITY): Payer: Self-pay | Admitting: *Deleted

## 2023-09-04 ENCOUNTER — Emergency Department (HOSPITAL_COMMUNITY)

## 2023-09-04 DIAGNOSIS — S39012A Strain of muscle, fascia and tendon of lower back, initial encounter: Secondary | ICD-10-CM | POA: Diagnosis not present

## 2023-09-04 DIAGNOSIS — M545 Low back pain, unspecified: Secondary | ICD-10-CM | POA: Diagnosis present

## 2023-09-04 DIAGNOSIS — X500XXA Overexertion from strenuous movement or load, initial encounter: Secondary | ICD-10-CM | POA: Diagnosis not present

## 2023-09-04 LAB — CBC WITH DIFFERENTIAL/PLATELET
Abs Immature Granulocytes: 0.07 K/uL (ref 0.00–0.07)
Basophils Absolute: 0.1 K/uL (ref 0.0–0.1)
Basophils Relative: 1 %
Eosinophils Absolute: 0.2 K/uL (ref 0.0–0.5)
Eosinophils Relative: 3 %
HCT: 26.1 % — ABNORMAL LOW (ref 36.0–46.0)
Hemoglobin: 7.6 g/dL — ABNORMAL LOW (ref 12.0–15.0)
Immature Granulocytes: 1 %
Lymphocytes Relative: 29 %
Lymphs Abs: 2.2 K/uL (ref 0.7–4.0)
MCH: 23 pg — ABNORMAL LOW (ref 26.0–34.0)
MCHC: 29.1 g/dL — ABNORMAL LOW (ref 30.0–36.0)
MCV: 79.1 fL — ABNORMAL LOW (ref 80.0–100.0)
Monocytes Absolute: 0.5 K/uL (ref 0.1–1.0)
Monocytes Relative: 7 %
Neutro Abs: 4.5 K/uL (ref 1.7–7.7)
Neutrophils Relative %: 59 %
Platelets: 484 K/uL — ABNORMAL HIGH (ref 150–400)
RBC: 3.3 MIL/uL — ABNORMAL LOW (ref 3.87–5.11)
RDW: 20.1 % — ABNORMAL HIGH (ref 11.5–15.5)
WBC: 7.6 K/uL (ref 4.0–10.5)
nRBC: 1.5 % — ABNORMAL HIGH (ref 0.0–0.2)

## 2023-09-04 LAB — COMPREHENSIVE METABOLIC PANEL WITH GFR
ALT: 18 U/L (ref 0–44)
AST: 26 U/L (ref 15–41)
Albumin: 3.8 g/dL (ref 3.5–5.0)
Alkaline Phosphatase: 59 U/L (ref 38–126)
Anion gap: 10 (ref 5–15)
BUN: 5 mg/dL — ABNORMAL LOW (ref 6–20)
CO2: 25 mmol/L (ref 22–32)
Calcium: 9.7 mg/dL (ref 8.9–10.3)
Chloride: 104 mmol/L (ref 98–111)
Creatinine, Ser: 0.71 mg/dL (ref 0.44–1.00)
GFR, Estimated: >60 mL/min (ref >60)
Glucose, Bld: 92 mg/dL (ref 70–99)
Potassium: 3.4 mmol/L — ABNORMAL LOW (ref 3.5–5.1)
Sodium: 139 mmol/L (ref 135–145)
Total Bilirubin: 0.5 mg/dL (ref 0.0–1.2)
Total Protein: 7.7 g/dL (ref 6.5–8.1)

## 2023-09-04 LAB — URINALYSIS, ROUTINE W REFLEX MICROSCOPIC
Bilirubin Urine: NEGATIVE
Glucose, UA: NEGATIVE mg/dL
Ketones, ur: NEGATIVE mg/dL
Leukocytes,Ua: NEGATIVE
Nitrite: NEGATIVE
Protein, ur: NEGATIVE mg/dL
Specific Gravity, Urine: 1.018 (ref 1.005–1.030)
pH: 6 (ref 5.0–8.0)

## 2023-09-04 LAB — PREGNANCY, URINE: Preg Test, Ur: NEGATIVE

## 2023-09-04 MED ORDER — ACETAMINOPHEN 500 MG PO TABS: 500.0000 mg | ORAL_TABLET | Freq: Four times a day (QID) | ORAL | 0 refills | Status: AC | PRN

## 2023-09-04 MED ORDER — CYCLOBENZAPRINE HCL 5 MG PO TABS: 5.0000 mg | ORAL_TABLET | Freq: Two times a day (BID) | ORAL | 0 refills | Status: DC | PRN

## 2023-09-04 NOTE — ED Provider Triage Note (Signed)
 Emergency Medicine Provider Triage Evaluation Note  Emily Rose , a 38 y.o. female  was evaluated in triage.  Pt complains of back pain.  Patient reports ongoing severe low back pain and recently started on iron infusion for uterine fibroids. No history of kidney stones or renal abnormalities. Denies nausea, vomiting, or diarrhea.  Review of Systems  Positive: As above Negative: As above  Physical Exam  BP (!) 156/102   Pulse 72   Temp 98.5 F (36.9 C)   Resp 16   Ht 5' 2 (1.575 m)   Wt 56.9 kg   LMP 09/01/2023   SpO2 100%   BMI 22.94 kg/m  Gen:   Awake, no distress   Resp:  Normal effort  MSK:   Moves extremities without difficulty. Pain towards the hips bilaterael Other:  No CVA or abdominal tenderness  Medical Decision Making  Medically screening exam initiated at 7:40 PM.  Appropriate orders placed.  Jolissa Buffone was informed that the remainder of the evaluation will be completed by another provider, this initial triage assessment does not replace that evaluation, and the importance of remaining in the ED until their evaluation is complete.     Belal Scallon A, PA-C 09/04/23 1942

## 2023-09-04 NOTE — ED Triage Notes (Signed)
 The pt received an iron infusion  Thursday  her first one  lower back pain since yesterday  today she has a headache  she is getting infusion for heavy periods  lmp 2-3 days ago

## 2023-09-04 NOTE — ED Provider Notes (Signed)
 [place] EMERGENCY DEPARTMENT AT [facility] Provider Note   CSN: [id number] Arrival date & time: 09/04/23  1819     Patient presents with: Back Pain   Emily Rose is a 38 y.o. female with history of menorrhagia with regular cycle, fibroids and anemia.  The patient presents to the ED for evaluation of bilateral low back pain.  Reports that for the last 2 days she has been experiencing intermittent low back pain.  She denies any recent trauma, event to account for this pain.  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  Reports that she first noticed this pain 2 days ago after getting off of her shift.  She denies that she has any current pain.  She reports that she has not had any pain today.  She reports that she received an iron infusion this past Thursday, her first 1, for anemia.  Reports that a few hours after this infusion she developed bilateral low back pain.  She denies any urinary symptoms.  She denies nausea, vomiting, fevers, diarrhea, abdominal pain.  Denies bilateral incontinence, distal weakness or numbness of the groin.  Reports LMP was on Wednesday.  Denies any active bleeding.  Reports that she was able to dance at a party yesterday despite this pain.  Denies medications prior to arrival.  Patient mother at bedside.  Concerned that her kidneys are being affected by iron infusions.    Back Pain      Prior to Admission medications   Medication Sig Start Date End Date Taking? Authorizing Provider  acetaminophen  (TYLENOL ) 500 MG tablet Take 1 tablet (500 mg total) by mouth every 6 (six) hours as needed. 09/04/23  Yes Ruthell Lonni FALCON, PA-C  cyclobenzaprine  (FLEXERIL ) 5 MG tablet Take 1 tablet (5 mg total) by mouth 2 (two) times daily as needed for muscle spasms. 09/04/23  Yes Ruthell Lonni FALCON, PA-C  albuterol  (VENTOLIN  HFA) 108 (90 Base) MCG/ACT inhaler INHALE 1-2 PUFFS INTO THE LUNGS EVERY 4 HOURS AS  NEEDED FOR WHEEZING OR SHORTNESS OF BREATH. 01/14/23   Colette Torrence GRADE, MD  ferrous sulfate  325 (65 FE) MG EC tablet Take 1 tablet (325 mg total) by mouth daily with breakfast. 12/22/22   Colette Torrence GRADE, MD  fluticasone  (FLONASE ) 50 MCG/ACT nasal spray Place 2 sprays into both nostrils daily. 12/03/22   Van Knee, MD  Spacer/Aero-Holding Chambers (AEROCHAMBER MV) inhaler Use as instructed 12/03/22   Mortenson, Ashley, MD  triamcinolone  cream (KENALOG ) 0.1 % Apply 1 Application topically 2 (two) times daily as needed. 12/21/22   Colette Torrence GRADE, MD    Allergies: Iodine, Shellfish allergy, and Motrin [ibuprofen]    Review of Systems  Musculoskeletal:  Positive for back pain.  All other systems reviewed and are negative.   Updated Vital Signs BP (!) 155/108   Pulse 67   Temp 98.5 F (36.9 C)   Resp 16   Ht 5' 2 (1.575 m)   Wt 56.9 kg   LMP 09/01/2023   SpO2 100%   BMI 22.94 kg/m   Physical Exam Vitals and nursing note reviewed.  Constitutional:      General: She is not in acute distress.    Appearance: She is well-developed.  HENT:     Head: Normocephalic and atraumatic.  Eyes:     Conjunctiva/sclera: Conjunctivae normal.  Cardiovascular:     Rate and Rhythm: Normal rate and regular rhythm.  Heart sounds: No murmur heard. Pulmonary:     Effort: Pulmonary effort is normal. No respiratory distress.     Breath sounds: Normal breath sounds.  Abdominal:     Palpations: Abdomen is soft.     Tenderness: There is no abdominal tenderness.  Musculoskeletal:        General: No swelling.     Cervical back: Neck supple.     Comments: No tenderness to lumbar spine  Skin:    General: Skin is warm and dry.     Capillary Refill: Capillary refill takes less than 2 seconds.  Neurological:     General: No focal deficit present.     Mental Status: She is alert and oriented to person, place, and time. Mental status is at baseline.     GCS: GCS eye subscore is 4. GCS  verbal subscore is 5. GCS motor subscore is 6.     Cranial Nerves: Cranial nerves 2-12 are intact. No cranial nerve deficit.     Sensory: Sensation is intact. No sensory deficit.     Motor: No weakness.     Coordination: Coordination is intact. Heel to [facility] Test normal.     Comments: 5 out of 5 strength of bilateral lower extremities  Psychiatric:        Mood and Affect: Mood normal.     (all labs ordered are listed, but only abnormal results are displayed) Labs Reviewed  CBC WITH DIFFERENTIAL/PLATELET - Abnormal; Notable for the following components:      Result Value   RBC 3.30 (*)    Hemoglobin 7.6 (*)    HCT 26.1 (*)    MCV 79.1 (*)    MCH 23.0 (*)    MCHC 29.1 (*)    RDW 20.1 (*)    Platelets 484 (*)    nRBC 1.5 (*)    All other components within normal limits  COMPREHENSIVE METABOLIC PANEL WITH GFR - Abnormal; Notable for the following components:   Potassium 3.4 (*)    BUN 5 (*)    All other components within normal limits  URINALYSIS, ROUTINE W REFLEX MICROSCOPIC - Abnormal; Notable for the following components:   APPearance HAZY (*)    Hgb urine dipstick MODERATE (*)    Bacteria, UA RARE (*)    All other components within normal limits  PREGNANCY, URINE    EKG: None  Radiology: CT Renal Stone Study Result Date: 09/04/2023 CLINICAL DATA:  Right flank pain EXAM: CT ABDOMEN AND PELVIS WITHOUT CONTRAST TECHNIQUE: Multidetector CT imaging of the abdomen and pelvis was performed following the standard protocol without IV contrast. RADIATION DOSE REDUCTION: This exam was performed according to the departmental dose-optimization program which includes automated exposure control, adjustment of the mA and/or kV according to patient size and/or use of iterative reconstruction technique. COMPARISON:  None Available. FINDINGS: Lower chest: No acute findings Hepatobiliary: No focal hepatic abnormality. Gallbladder unremarkable. Pancreas: No focal abnormality or ductal dilatation.  Spleen: No focal abnormality.  Normal size. Adrenals/Urinary Tract: No adrenal abnormality. No focal renal abnormality. No stones or hydronephrosis. Urinary bladder is unremarkable. Stomach/Bowel: Stomach is within normal limits. Appendix appears normal. No evidence of bowel wall thickening, distention, or inflammatory changes. Vascular/Lymphatic: No evidence of aneurysm or adenopathy. Reproductive: Enlarged uterus with fibroids measuring up to 4 cm. No adnexal mass. Other: No free fluid or free air. Musculoskeletal: No acute bony abnormality. IMPRESSION: No renal or ureteral stones.  No hydronephrosis. Normal appendix. Enlarged fibroid uterus. No acute findings. Electronically  Signed   By: Franky Crease M.D.   On: 09/04/2023 23:50    Procedures   Medications Ordered in the ED - No data to display    Medical Decision Making Amount and/or Complexity of Data Reviewed Radiology: ordered.   This is a 38 year old female presenting for bilateral lower back pain.  Reports onset 2 days ago.  She denies any trauma to account for this pain.  She denies any urinary symptoms, nausea, vomiting, fevers, abdominal pain.  She denies any pain at this current moment of examination.  Overall, she is afebrile and nontachycardic.  Her lung sounds are clear bilaterally, she is not hypoxic.  There is no abdominal tenderness, there is no CVA tenderness.  She is nontoxic in appearance with reassuring vital signs.  Lab work initiated in triage include CBC, CMP, urinalysis, pregnancy test.  CBC without leukocytosis, hemoglobin 7.6 which appears to be baseline.  Reports she recently stopped menstrual cycle.  States that she is not actively bleeding.  CMP with slightly decreased potassium 3.4, no other electrolyte derangement, no elevated LFT, anion gap 10.  Pregnancy test negative.  Urinalysis shows hemoglobin moderate, rare bacteria, squamous epithelial cells.  She denies any urinary symptoms.  Discussed with patient and  patient mother at the bedside that I felt patient presentation more consistent with lumbar strain.  Patient mother is concerned the patient has acute issue with her kidney because she recently received blood transfusion and now she has bilateral low back pain.  Patient mother was counseled that patient urine sample appears to show no signs of infection, no elevated creatinine here tonight so I doubt the patient has any renal insufficiency.  Advised patient mother that most likely cause of patient low back pain due to lumbar strain.  Patient mother still not satisfied, patient mother was offered CT scan of kidneys to ensure there is no abnormality.  Patient was in agreement.  CT renal stone study obtained which is unremarkable.  Patient discharged at this time with naproxen and Flexeril .  Advised to follow-up with PCP.   Final diagnoses:  Strain of lumbar region, initial encounter    ED Discharge Orders          Ordered    acetaminophen  (TYLENOL ) 500 MG tablet  Every 6 hours PRN        09/04/23 2331    cyclobenzaprine  (FLEXERIL ) 5 MG tablet  2 times daily PRN        09/04/23 2331               Ruthell Lonni FALCON, PA-C 09/05/23 TIANA Midge Golas, MD 09/05/23 [phone]

## 2023-09-05 NOTE — Discharge Instructions (Addendum)
 It was a pleasure taking part in your care.  As we discussed, workup tonight is reassuring and shows no evidence of renal insufficiency.  Please begin taking Tylenol , 500 mg, every 6 hours as needed.  Take Flexeril  twice a day as needed for low back pain.  Do not drive or operate machinery while taking Flexeril , do not mix with alcohol.  Follow-up with your PCP.  Return to ED with any new symptoms.  Read attached guide concerning lumbar strain.

## 2023-09-09 ENCOUNTER — Ambulatory Visit

## 2023-09-09 VITALS — BP 136/87 | HR 95 | Temp 97.9°F | Resp 20 | Ht 62.0 in | Wt 125.0 lb

## 2023-09-09 DIAGNOSIS — D259 Leiomyoma of uterus, unspecified: Secondary | ICD-10-CM | POA: Diagnosis not present

## 2023-09-09 DIAGNOSIS — D500 Iron deficiency anemia secondary to blood loss (chronic): Secondary | ICD-10-CM | POA: Diagnosis not present

## 2023-09-09 DIAGNOSIS — N920 Excessive and frequent menstruation with regular cycle: Secondary | ICD-10-CM

## 2023-09-09 MED ORDER — SODIUM CHLORIDE 0.9 % IV SOLN
510.0000 mg | Freq: Once | INTRAVENOUS | Status: AC
  Administered 2023-09-09: 510 mg via INTRAVENOUS
  Filled 2023-09-09: qty 17

## 2023-09-09 MED ORDER — ACETAMINOPHEN 325 MG PO TABS
650.0000 mg | ORAL_TABLET | Freq: Once | ORAL | Status: AC
  Administered 2023-09-09: 650 mg via ORAL
  Filled 2023-09-09: qty 2

## 2023-09-09 MED ORDER — DIPHENHYDRAMINE HCL 25 MG PO CAPS
25.0000 mg | ORAL_CAPSULE | Freq: Once | ORAL | Status: AC
  Administered 2023-09-09: 25 mg via ORAL
  Filled 2023-09-09: qty 1

## 2023-09-09 NOTE — Progress Notes (Signed)
 Diagnosis: Iron Deficiency Anemia  Provider:  Praveen Mannam MD  Procedure: IV Infusion  IV Type: Peripheral, IV Location: L Antecubital  Feraheme (Ferumoxytol ), Dose: 510 mg  Infusion Start Time: 1616  Infusion Stop Time: 1632  Post Infusion IV Care: Observation period completed and Peripheral IV Discontinued  Discharge: Condition: Good, Destination: Home . AVS Declined  Performed by:  Leita FORBES Miles, LPN

## 2023-09-12 IMAGING — US US TRANSVAGINAL NON-OB
1 series · 13 of 25 positions shown · non-contrast
Comparison: None.

CLINICAL DATA: Left lower quadrant pain and vaginal bleeding.

EXAM:
TRANSABDOMINAL AND TRANSVAGINAL ULTRASOUND OF PELVIS
DOPPLER ULTRASOUND OF OVARIES
TECHNIQUE: Both transabdominal and transvaginal ultrasound examinations of the
pelvis were performed. Transabdominal technique was performed for
global imaging of the pelvis including uterus, ovaries, adnexal
regions, and pelvic cul-de-sac.
It was necessary to proceed with endovaginal exam following the
transabdominal exam to visualize the uterus, endometrium, bilateral
ovaries and bilateral adnexa. Color and duplex Doppler ultrasound
was utilized to evaluate blood flow to the ovaries.

[Series 1: us pelvis (transabdominal only) · 13 of 76 slices shown]
[im 1/76]
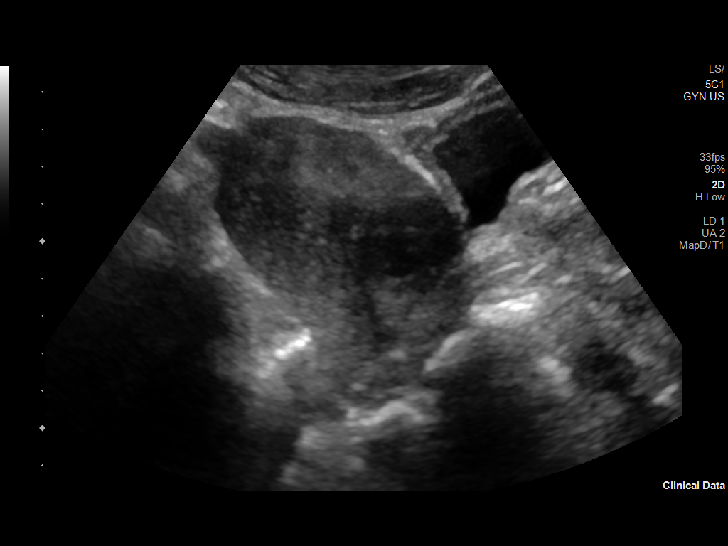
[im 7/76]
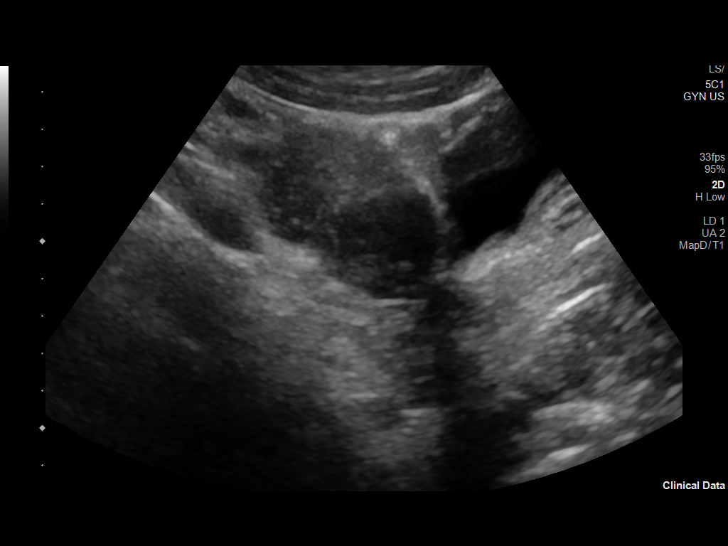
[im 13/76]
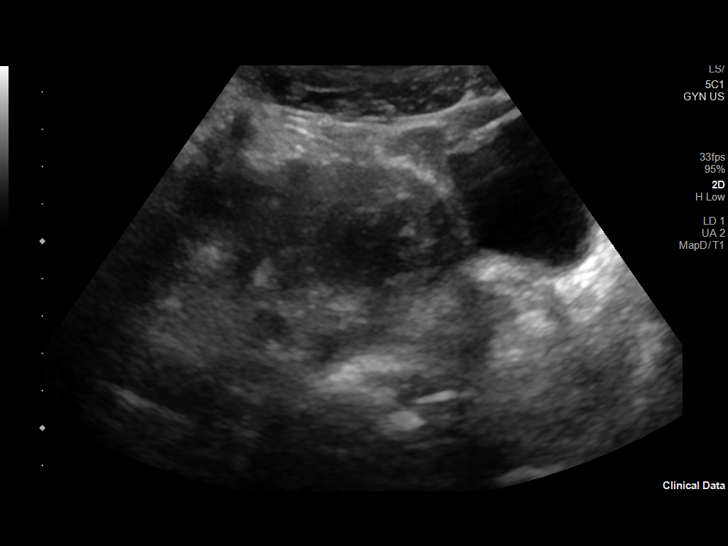
[im 19/76]
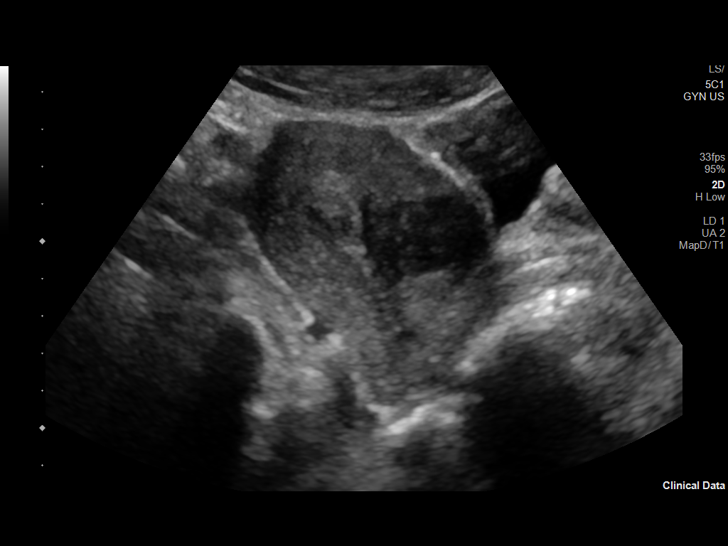
[im 26/76]
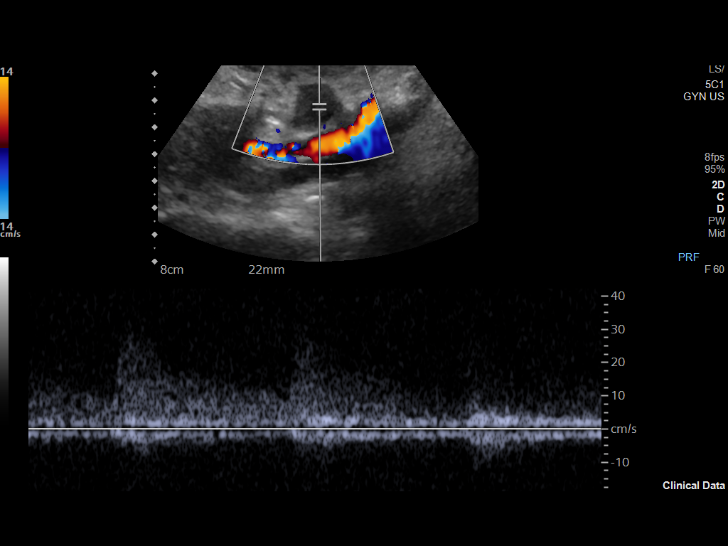
[im 32/76]
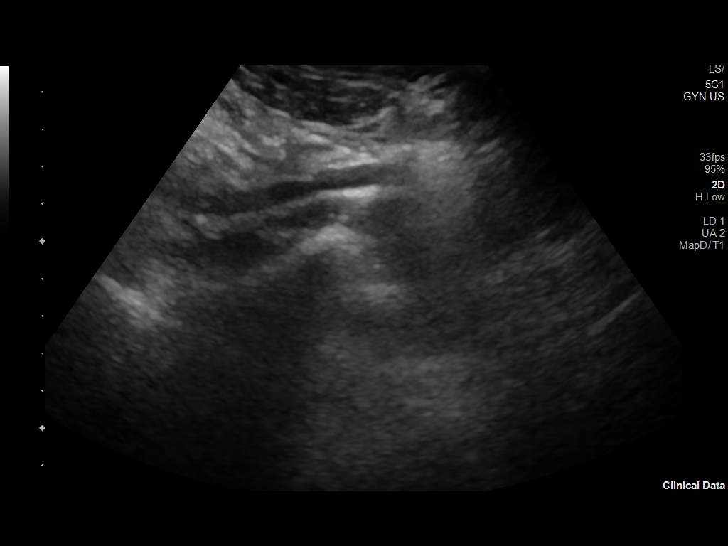
[im 38/76]
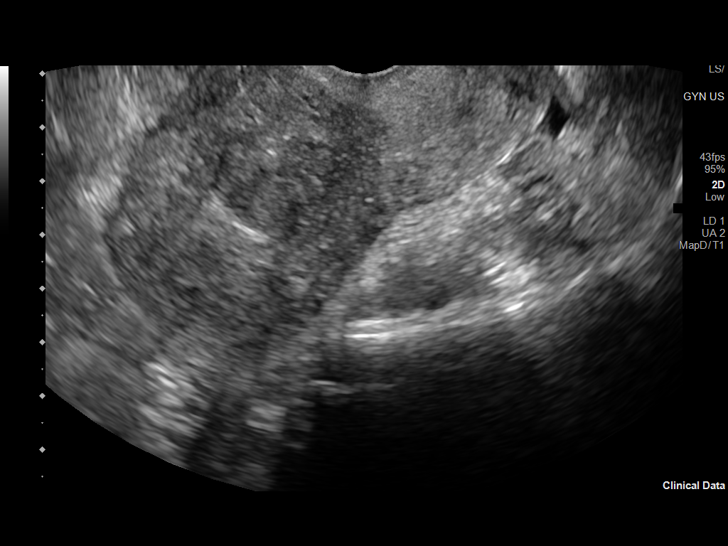
[im 44/76]
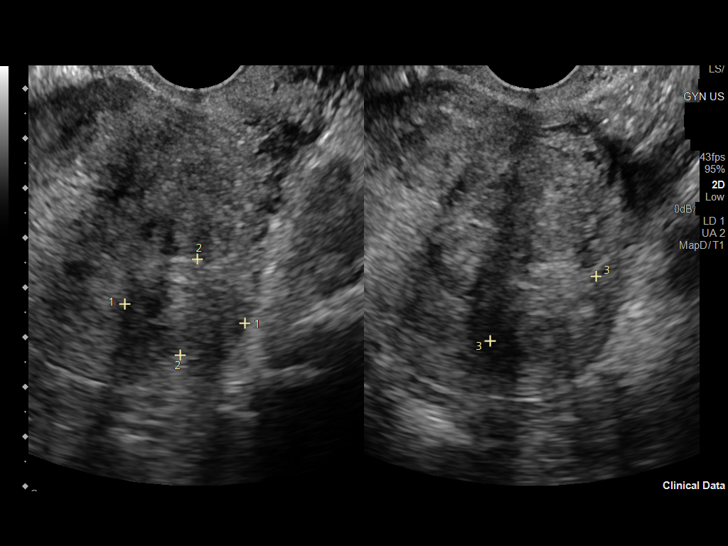
[im 51/76]
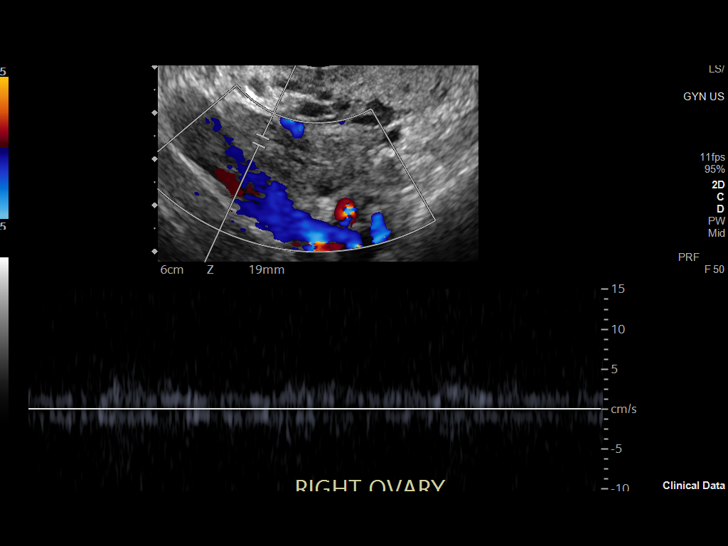
[im 57/76]
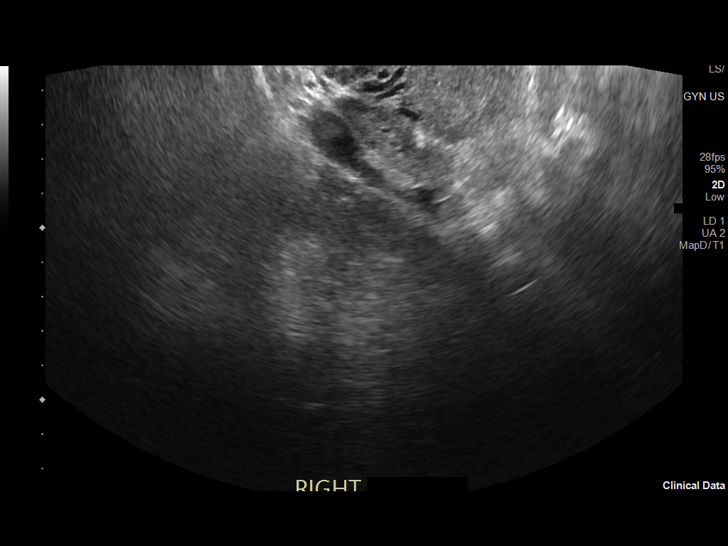
[im 63/76]
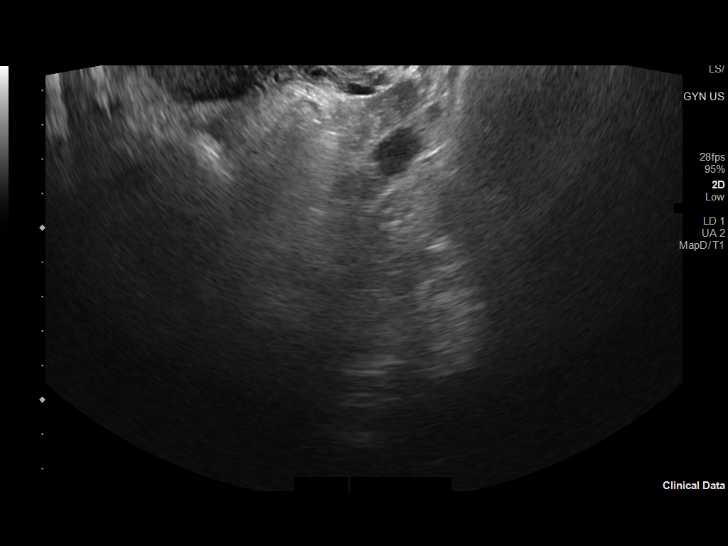
[im 69/76]
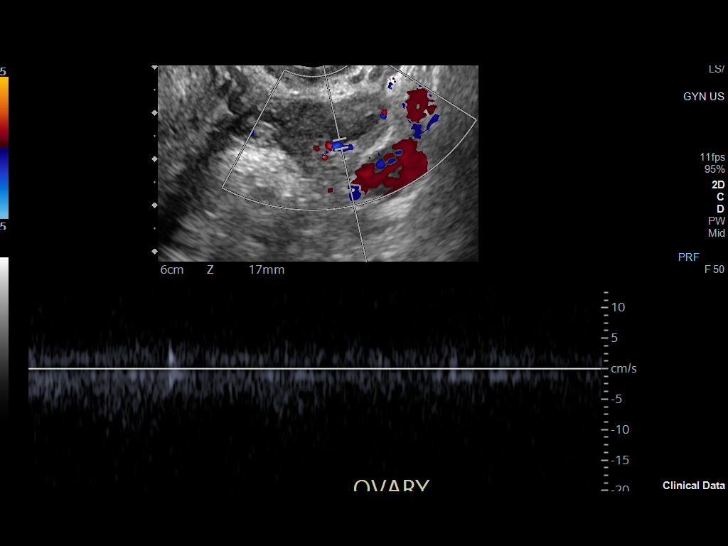
[im 76/76]
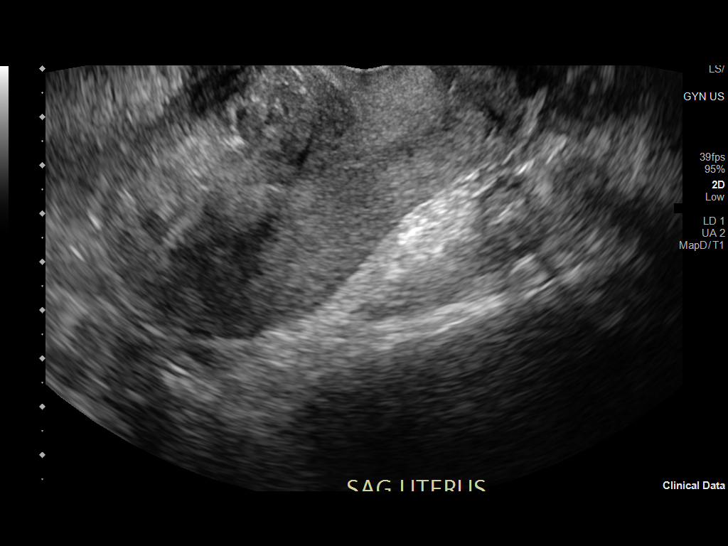

[13 of 25 positions shown; findings below may reference images not displayed]

FINDINGS: Uterus

Measurements: 8.3 cm x 5.1 cm x 5.7 cm = volume: 123.96 mL. Multiple
heterogeneous uterine fibroids are seen. The largest measures
approximately 2.8 cm x 1.7 cm x 2.7 cm.

Endometrium

Thickness: 9.13 mm.  No focal abnormality visualized.

Right ovary

Measurements: 3.1 cm x 1.1 cm x 2.3 cm = volume: 4.22 mL. Normal
appearance/no adnexal mass.

Left ovary

Measurements: 2.4 cm x 1.6 cm x 2.2 cm = volume: 4.38 mL. Normal
appearance/no adnexal mass.

Pulsed Doppler evaluation demonstrates normal low-resistance
arterial and venous waveforms in both ovaries.

Other: There is a trace amount of pelvic free fluid.
IMPRESSION: 1. Multiple heterogeneous uterine fibroids.
2. Trace amount of pelvic free fluid which is likely physiologic in
nature.

## 2023-09-12 IMAGING — US US ART/VEN ABD/PELV/SCROTUM DOPPLER LTD
1 series · 13 of 25 positions shown · non-contrast
Comparison: None.

CLINICAL DATA: Left lower quadrant pain and vaginal bleeding.

EXAM:
TRANSABDOMINAL AND TRANSVAGINAL ULTRASOUND OF PELVIS
DOPPLER ULTRASOUND OF OVARIES
TECHNIQUE: Both transabdominal and transvaginal ultrasound examinations of the
pelvis were performed. Transabdominal technique was performed for
global imaging of the pelvis including uterus, ovaries, adnexal
regions, and pelvic cul-de-sac.
It was necessary to proceed with endovaginal exam following the
transabdominal exam to visualize the uterus, endometrium, bilateral
ovaries and bilateral adnexa. Color and duplex Doppler ultrasound
was utilized to evaluate blood flow to the ovaries.

[Series 1: us pelvis (transabdominal only) · 13 of 76 slices shown]
[im 1/76]
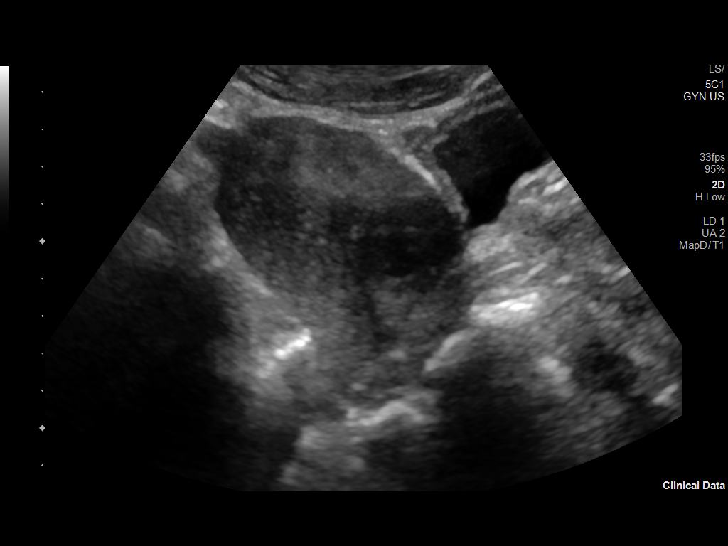
[im 7/76]
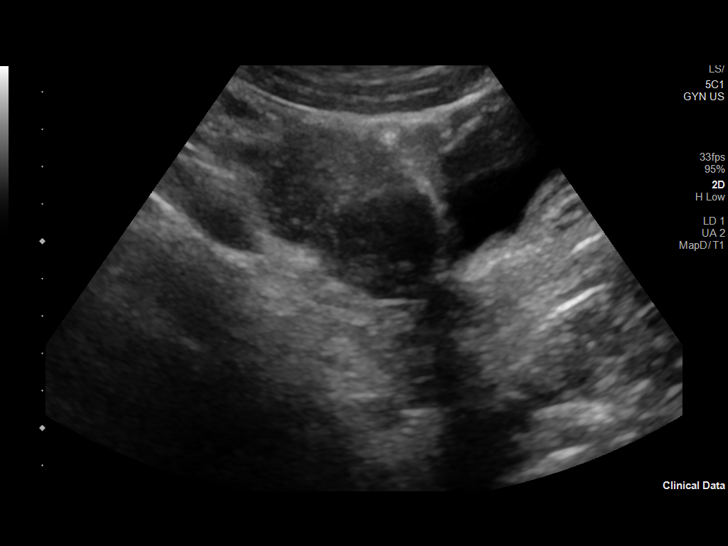
[im 13/76]
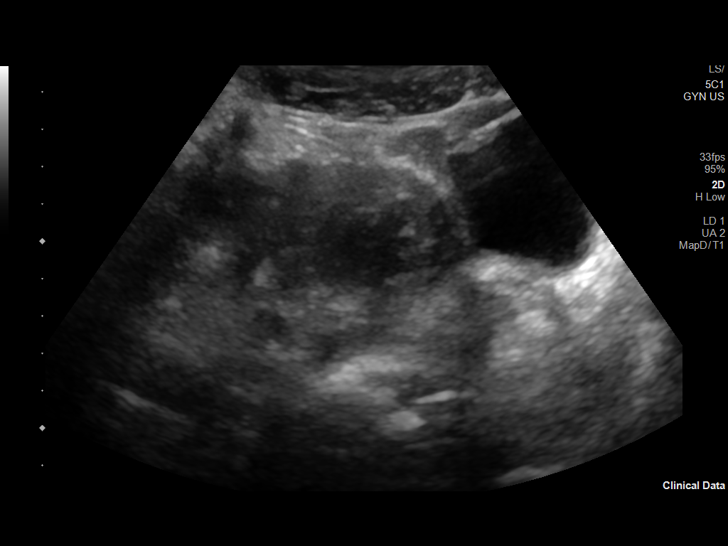
[im 19/76]
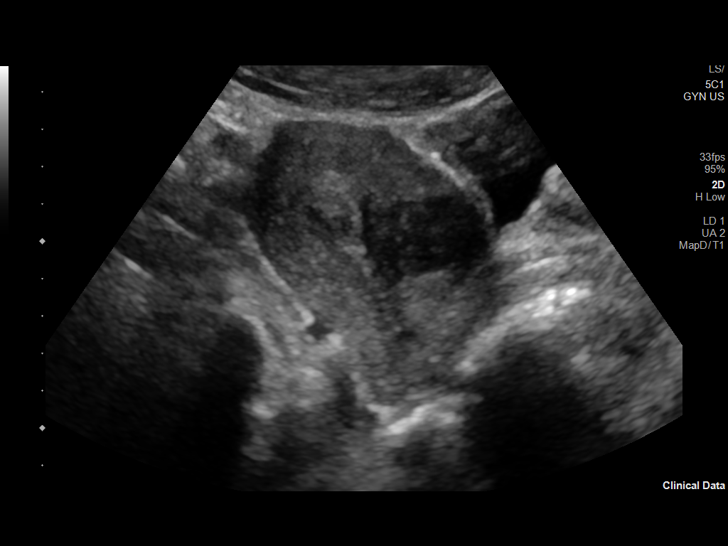
[im 26/76]
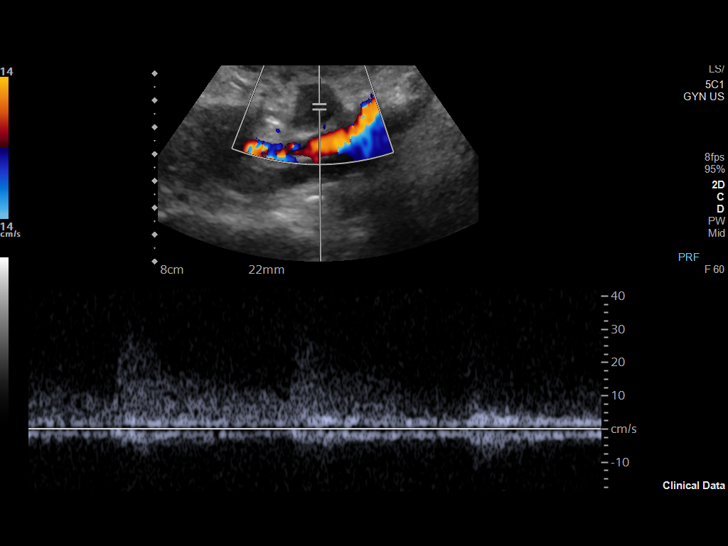
[im 32/76]
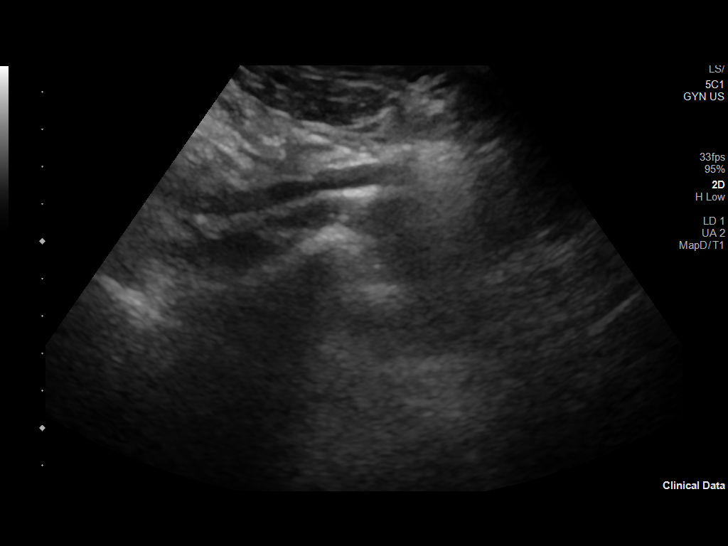
[im 38/76]
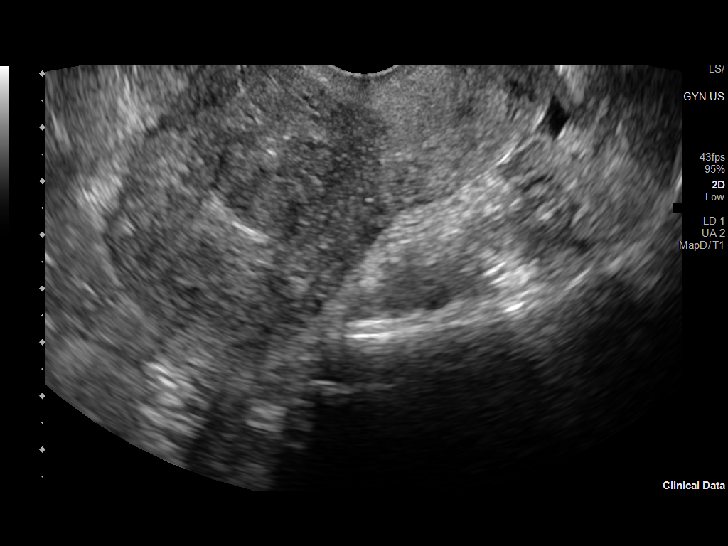
[im 44/76]
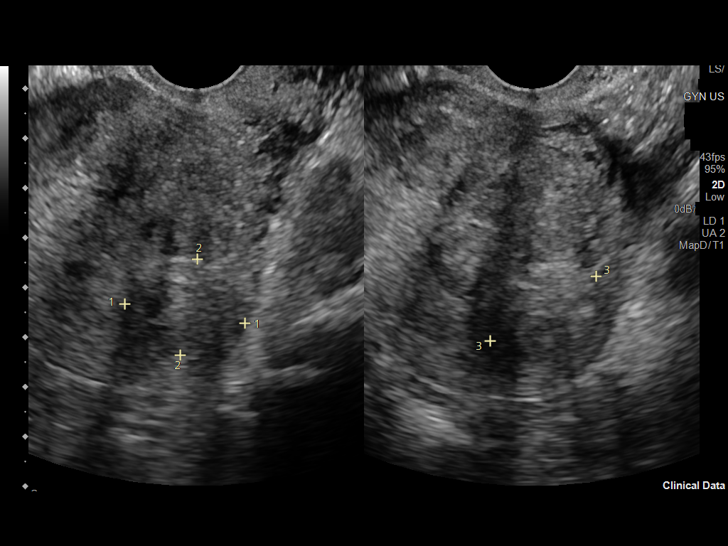
[im 51/76]
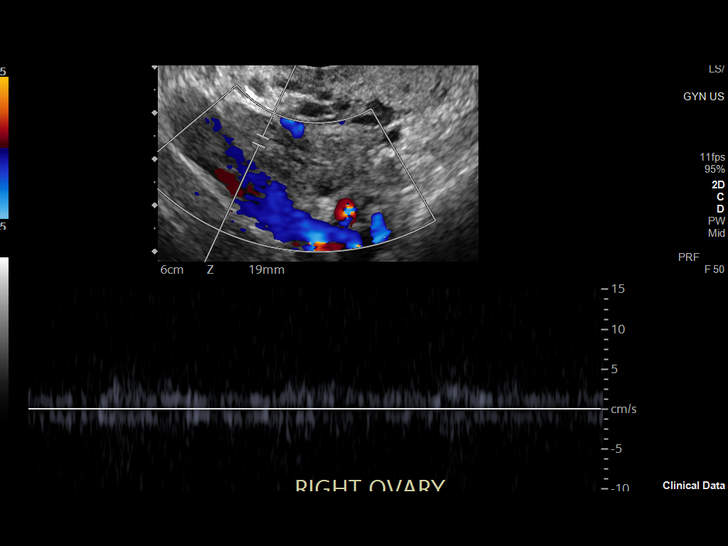
[im 57/76]
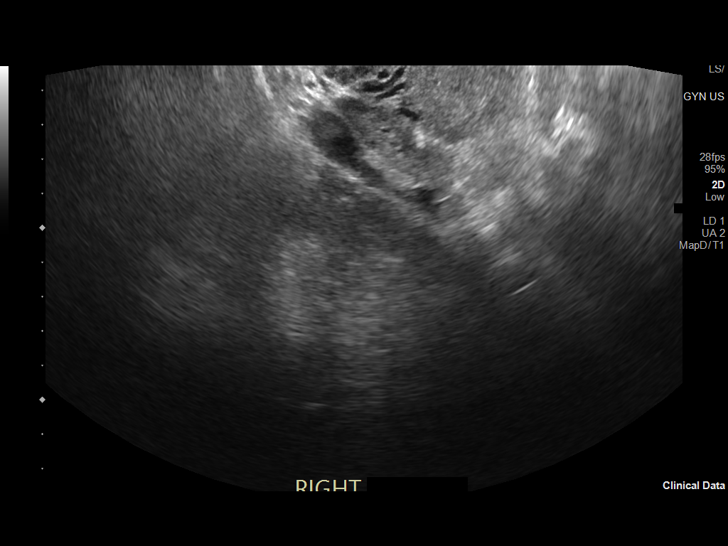
[im 63/76]
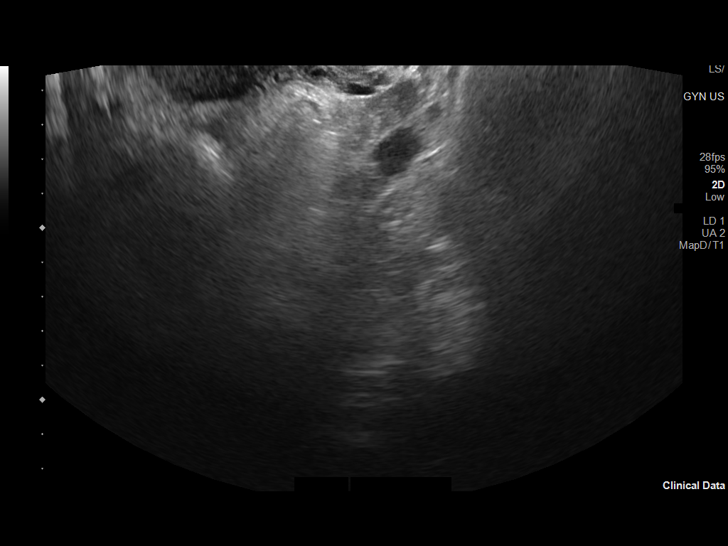
[im 69/76]
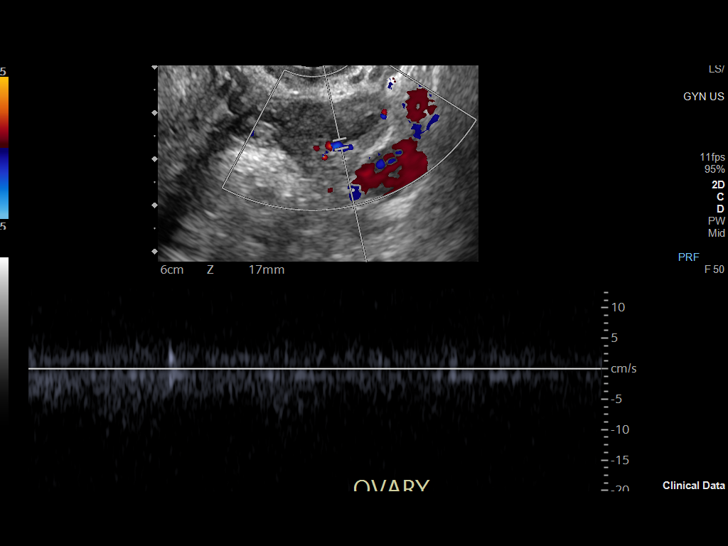
[im 76/76]
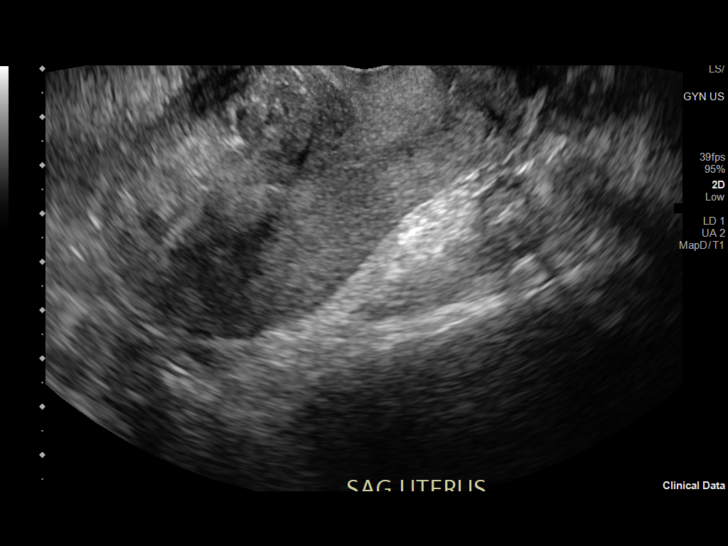

[13 of 25 positions shown; findings below may reference images not displayed]

FINDINGS: Uterus

Measurements: 8.3 cm x 5.1 cm x 5.7 cm = volume: 123.96 mL. Multiple
heterogeneous uterine fibroids are seen. The largest measures
approximately 2.8 cm x 1.7 cm x 2.7 cm.

Endometrium

Thickness: 9.13 mm.  No focal abnormality visualized.

Right ovary

Measurements: 3.1 cm x 1.1 cm x 2.3 cm = volume: 4.22 mL. Normal
appearance/no adnexal mass.

Left ovary

Measurements: 2.4 cm x 1.6 cm x 2.2 cm = volume: 4.38 mL. Normal
appearance/no adnexal mass.

Pulsed Doppler evaluation demonstrates normal low-resistance
arterial and venous waveforms in both ovaries.

Other: There is a trace amount of pelvic free fluid.
IMPRESSION: 1. Multiple heterogeneous uterine fibroids.
2. Trace amount of pelvic free fluid which is likely physiologic in
nature.

## 2023-09-12 IMAGING — US US PELVIS COMPLETE
1 series · 14 of 25 positions shown · non-contrast
Comparison: None.

CLINICAL DATA: Left lower quadrant pain and vaginal bleeding.

EXAM:
TRANSABDOMINAL ULTRASOUND OF PELVIS
DOPPLER ULTRASOUND OF OVARIES
TECHNIQUE: Transabdominal ultrasound examination of the pelvis was performed
including evaluation of the uterus, ovaries, adnexal regions, and
pelvic cul-de-sac.
Color and duplex Doppler ultrasound was utilized to evaluate blood
flow to the ovaries.

[Series 1: us pelvis (transabdominal only) · 14 of 76 slices shown]
[im 1/76]
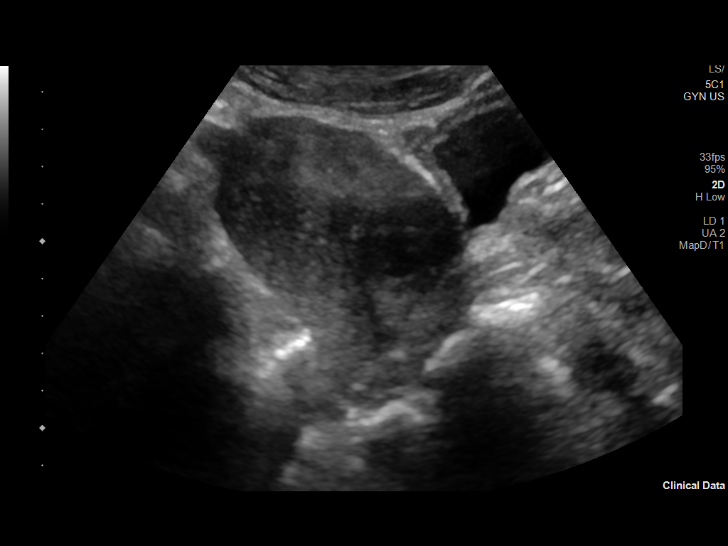
[im 7/76]
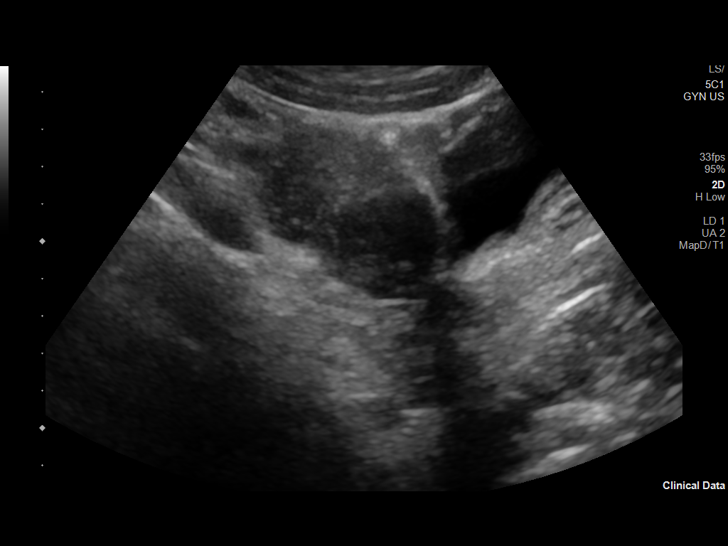
[im 13/76]
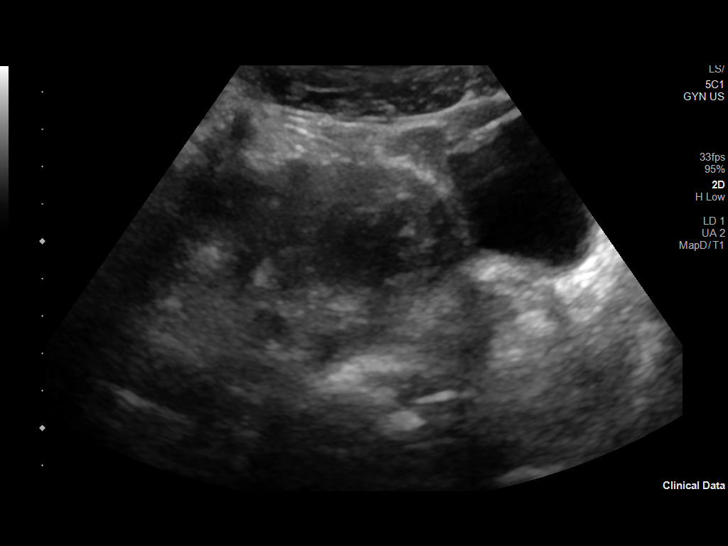
[im 19/76]
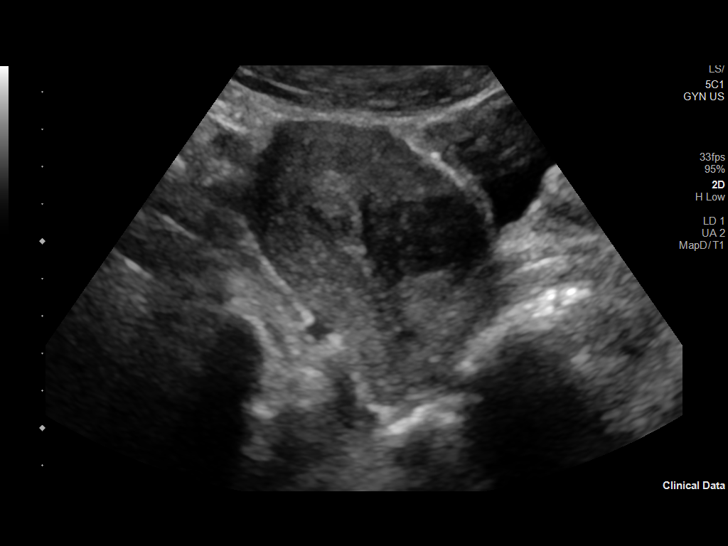
[im 26/76]
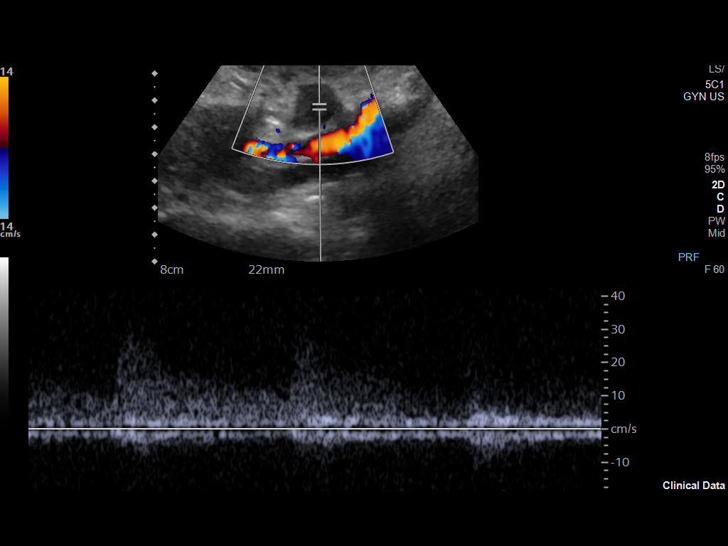
[im 29/76]
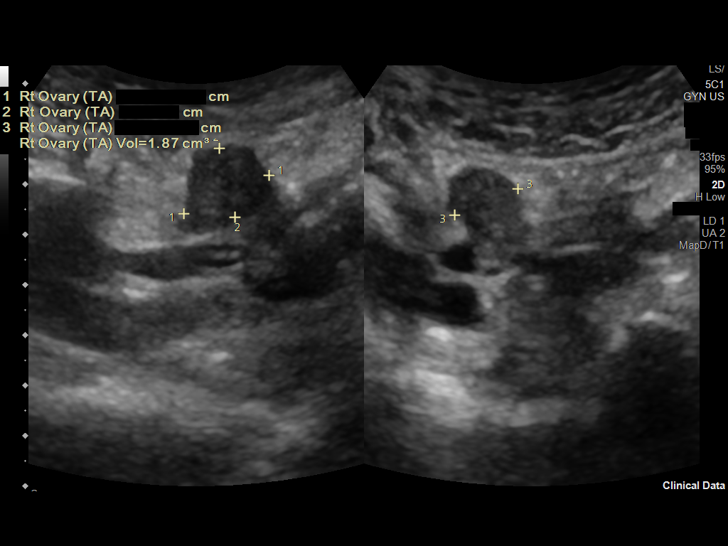
[im 35/76]
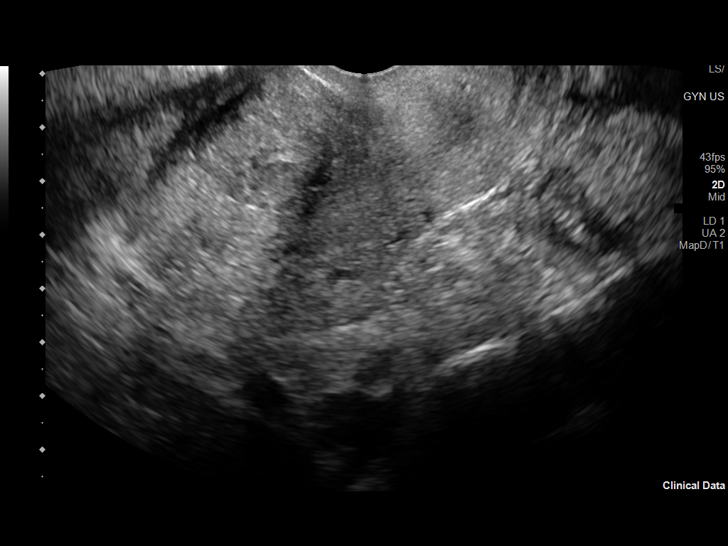
[im 41/76]
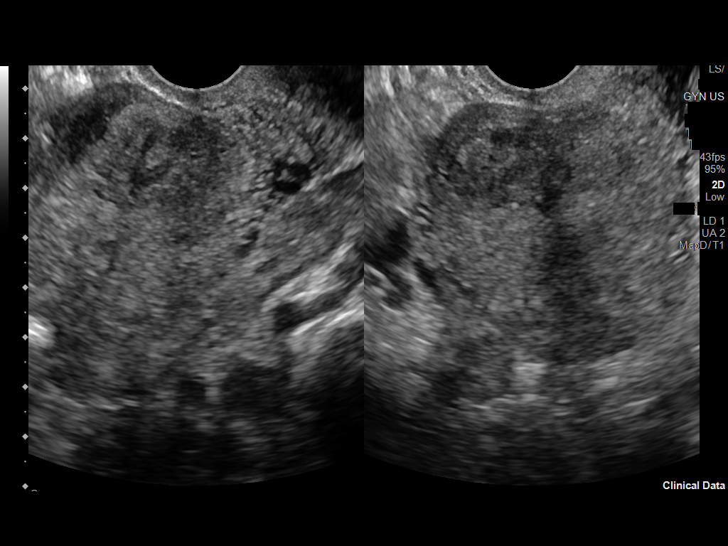
[im 47/76]
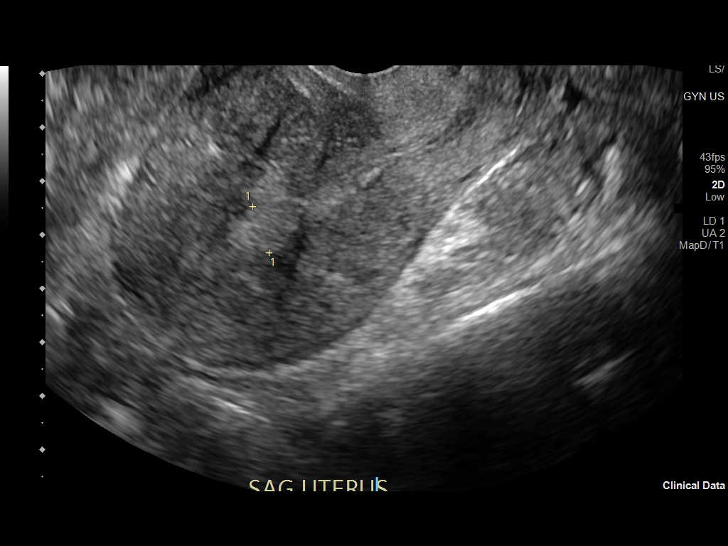
[im 51/76]
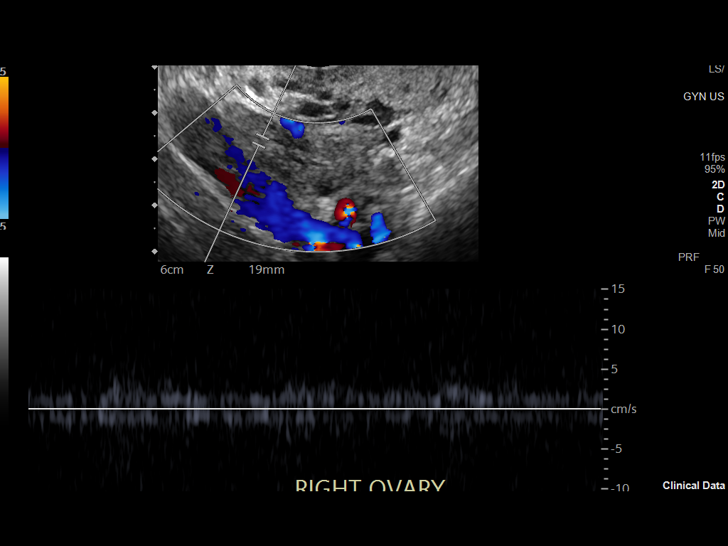
[im 57/76]
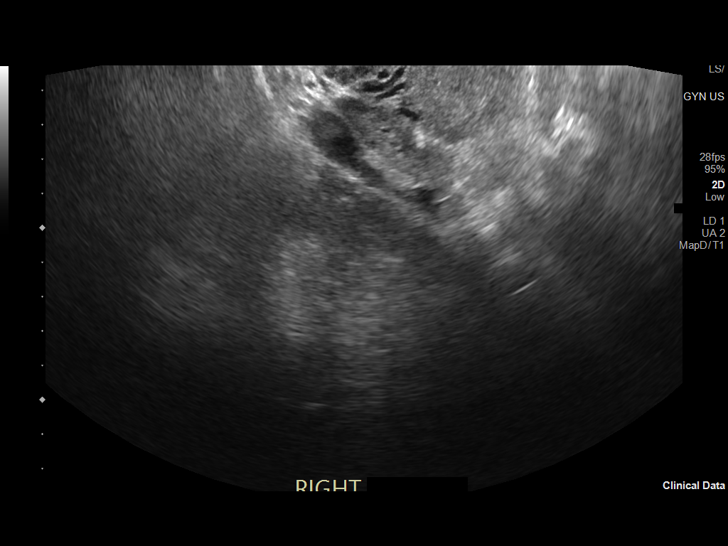
[im 63/76]
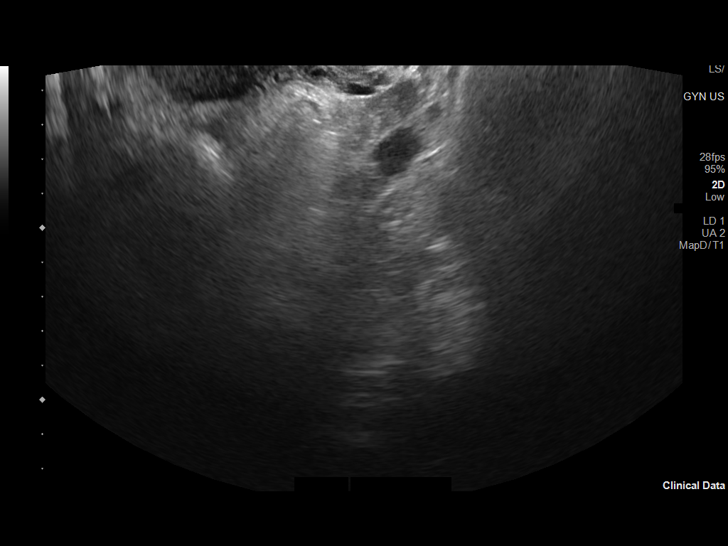
[im 69/76]
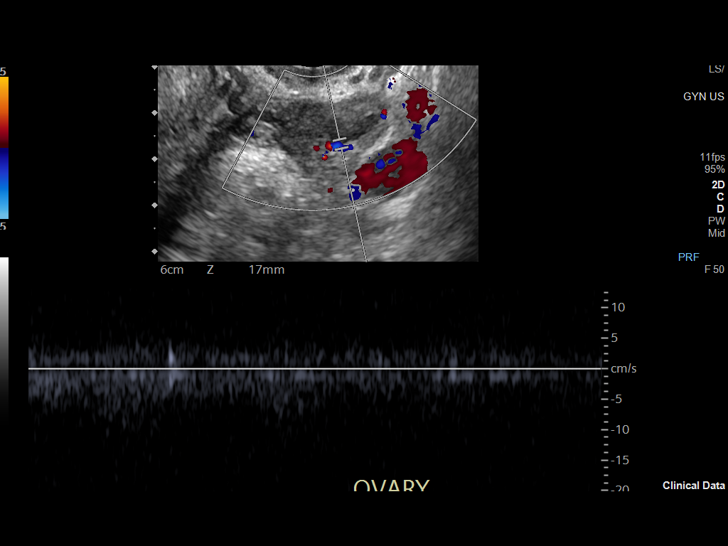
[im 76/76]
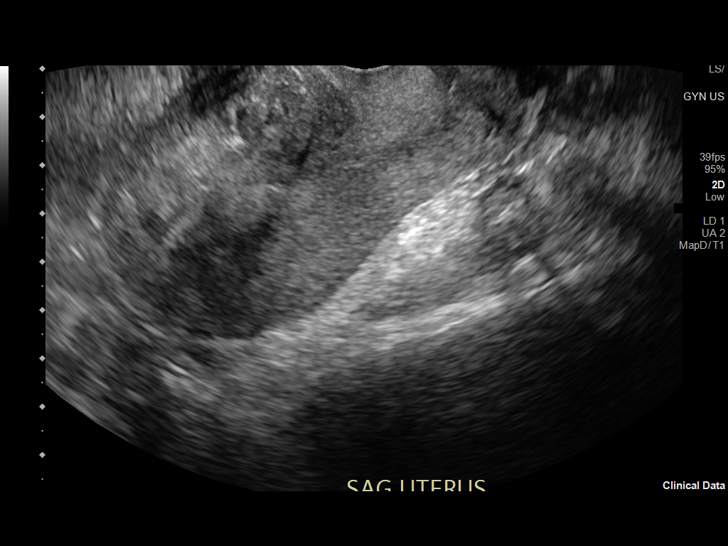

[14 of 25 positions shown; findings below may reference images not displayed]

FINDINGS: Uterus

Measurements: 8.3 cm x 5.1 cm x 5.7 cm = volume: 123.96 mL. Multiple
heterogeneous uterine fibroids are seen. The largest measures
approximately 2.8 cm x 1.7 cm x 2.7 cm.

Endometrium

Thickness: 9.13 mm.  No focal abnormality visualized.

Right ovary

Measurements: 3.1 cm x 1.1 cm x 2.3 cm = volume: 4.22 mL. Normal
appearance/no adnexal mass.

Left ovary

Measurements: 2.4 cm x 1.6 cm x 2.2 cm = volume: 4.38 mL. Normal
appearance/no adnexal mass.

Pulsed Doppler evaluation demonstrates normal low-resistance
arterial and venous waveforms in both ovaries.

Other: There is a trace amount of pelvic free fluid.
IMPRESSION: 1. Multiple heterogeneous uterine fibroids.
2. Trace amount of pelvic free fluid which is likely physiologic in
nature.

## 2023-09-22 IMAGING — US US PELVIS COMPLETE TRANSABD/TRANSVAG W DUPLEX AND/OR DOPPLER
1 series · 13 of 25 positions shown · non-contrast
Comparison: 03/12/2021

CLINICAL DATA: Pelvic inflammatory disease

EXAM:
TRANSABDOMINAL AND TRANSVAGINAL ULTRASOUND OF PELVIS
DOPPLER ULTRASOUND OF OVARIES
TECHNIQUE: Both transabdominal and transvaginal ultrasound examinations of the
pelvis were performed. Transabdominal technique was performed for
global imaging of the pelvis including uterus, ovaries, adnexal
regions, and pelvic cul-de-sac.
It was necessary to proceed with endovaginal exam following the
transabdominal exam to visualize the endometrium and ovaries. Color
and duplex Doppler ultrasound was utilized to evaluate blood flow to
the ovaries.

[Series 1: us pelvic complete w transvaginal and torsion righ · 13 of 120 slices shown]
[im 1/120]
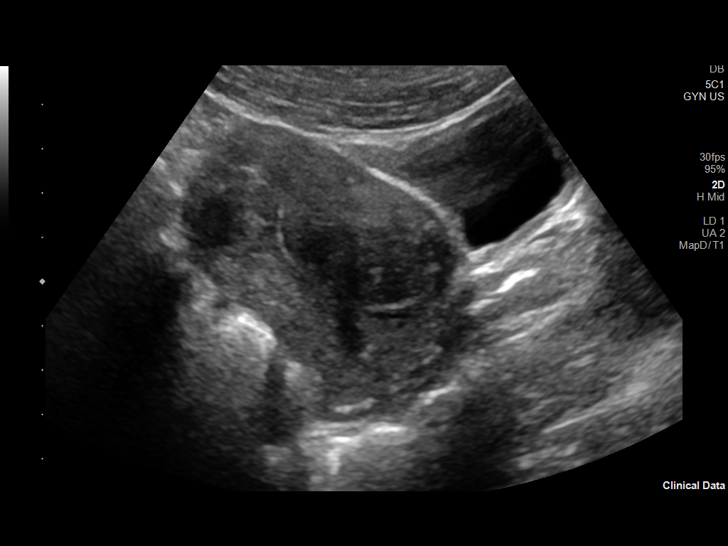
[im 10/120]
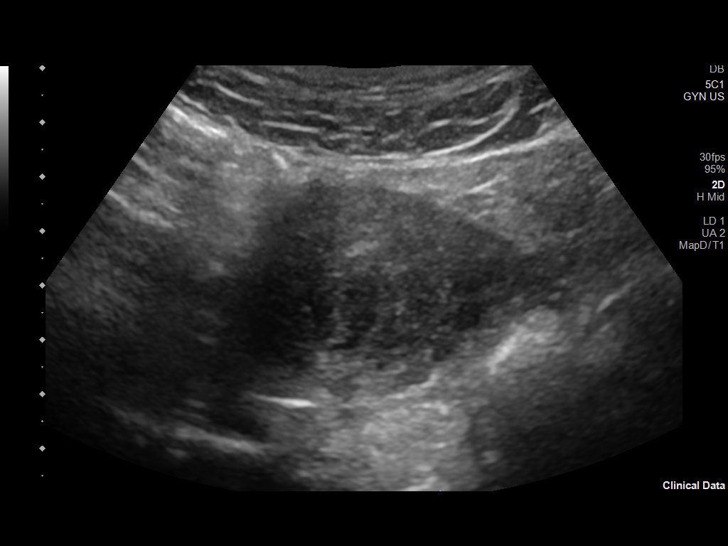
[im 20/120]
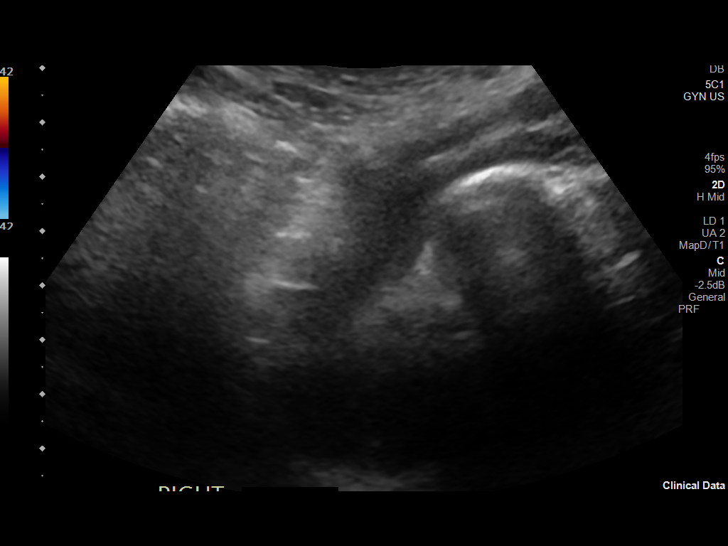
[im 30/120]
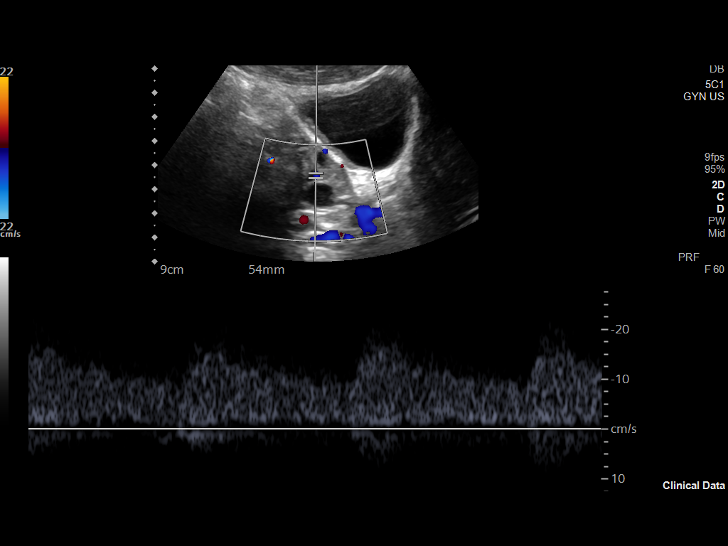
[im 40/120]
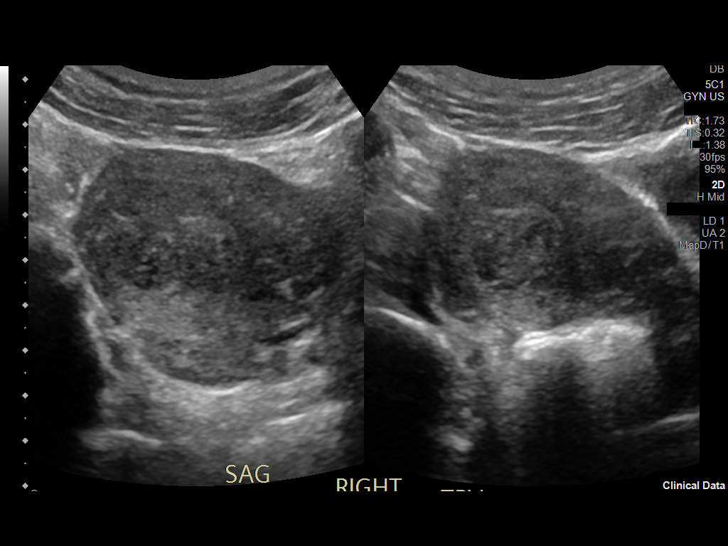
[im 50/120]
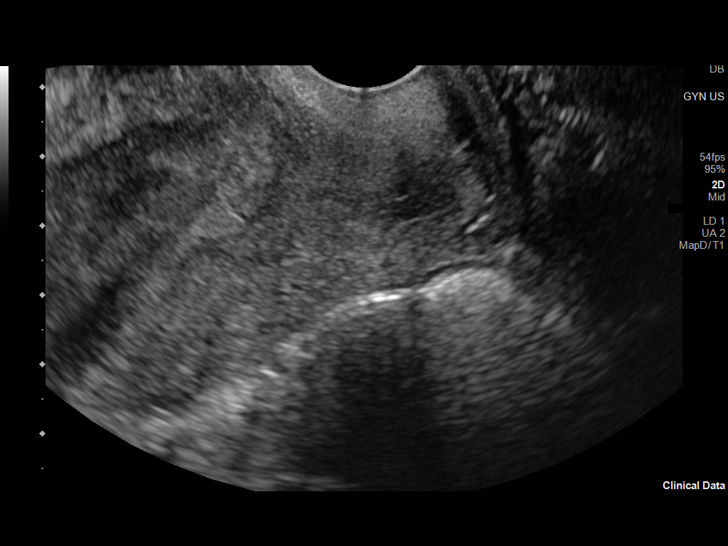
[im 60/120]
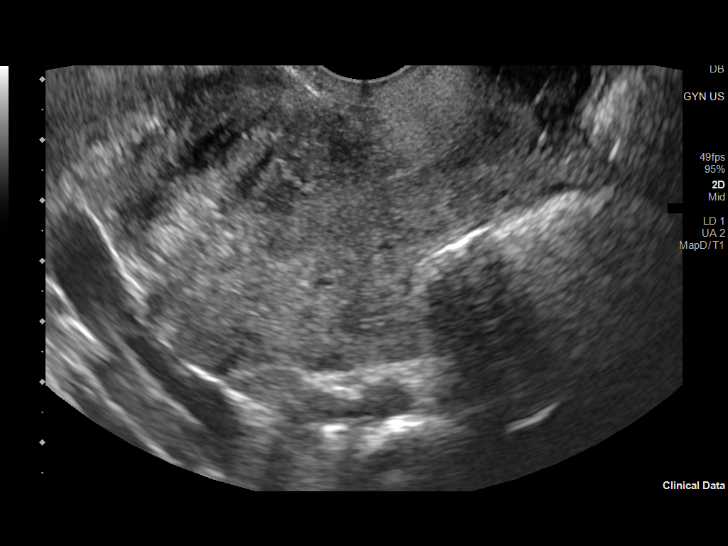
[im 70/120]
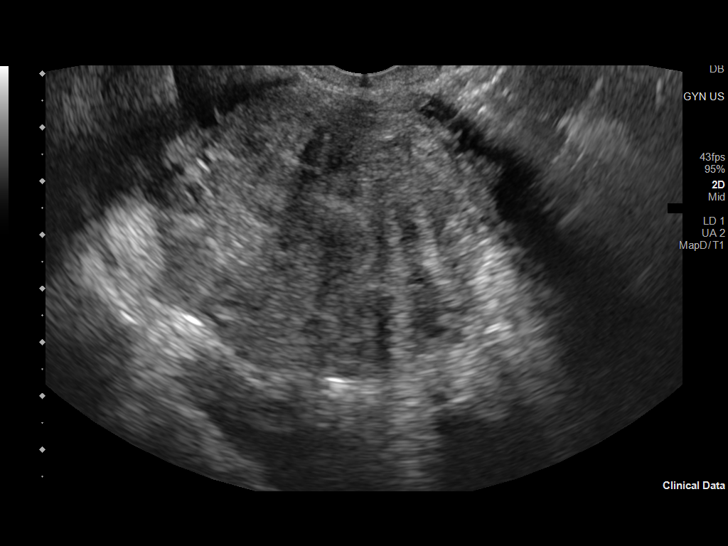
[im 80/120]
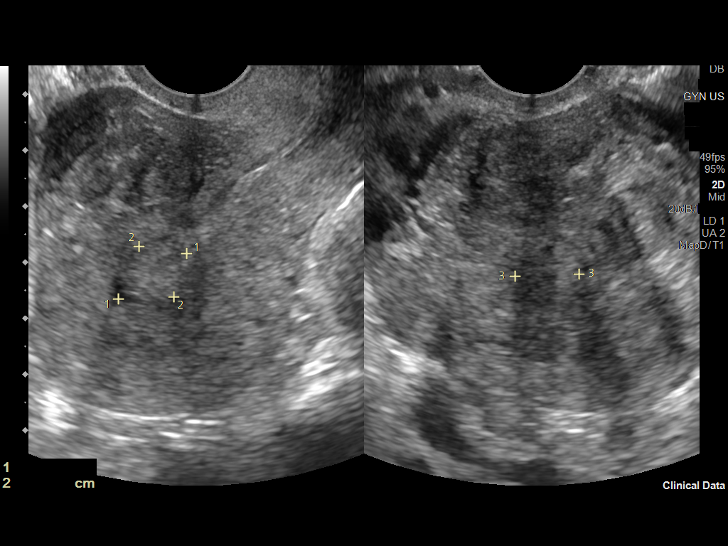
[im 90/120]
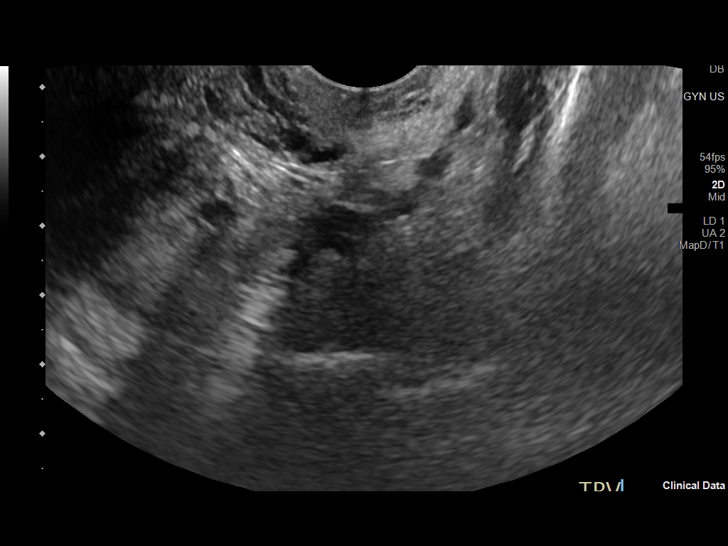
[im 100/120]
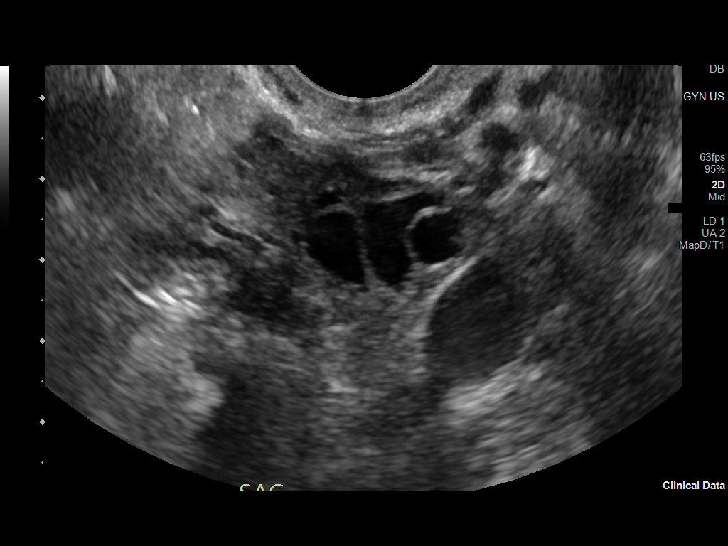
[im 110/120]
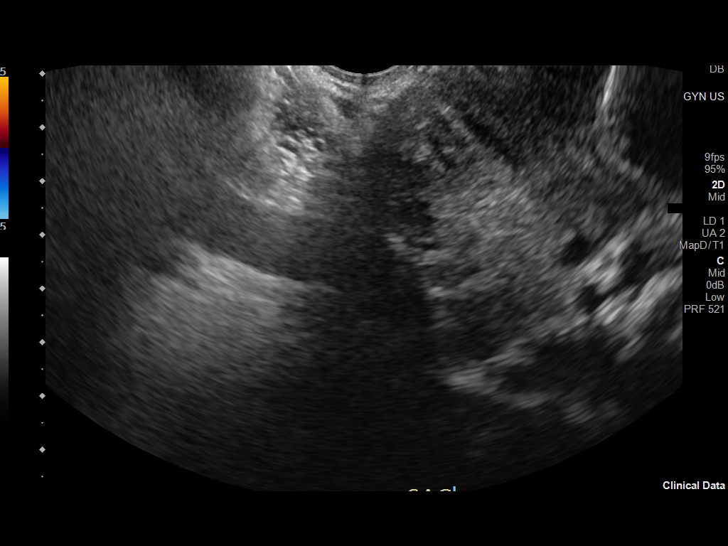
[im 120/120]
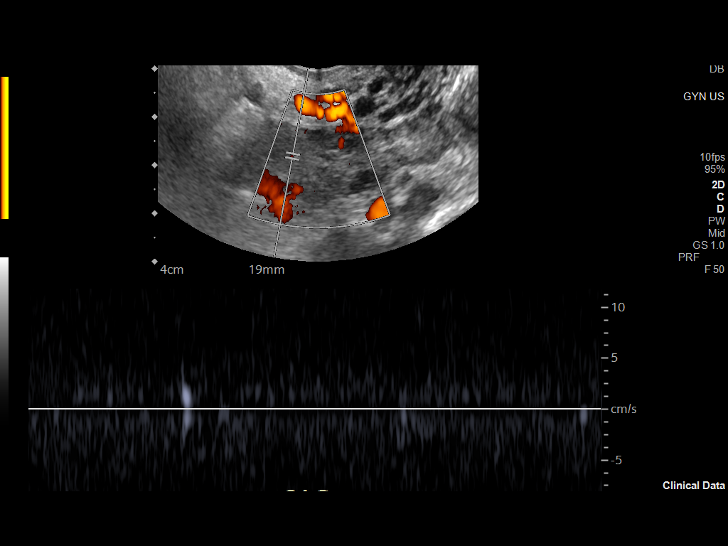

[13 of 25 positions shown; findings below may reference images not displayed]

FINDINGS: Uterus

Measurements: 7.5 x 5.0 x 5.6 cm = volume: 110 mL. Multiple uterine
fibroids, particularly a submucosal fibroid of the uterine fundus
measuring 1.5 cm. Nabothian cysts of the cervix.

Endometrium

Thickness: 6 mm.  No focal abnormality visualized.

Right ovary

Measurements: 2.9 x 1.4 x 1.5 cm = volume: 3 mL. Normal
appearance/no adnexal mass.

Left ovary

Measurements: 3.3 x 2.5 x 2.5 cm = volume: 11 mL. Normal
appearance/no adnexal mass. Multiple small follicles.

Pulsed Doppler evaluation of both ovaries demonstrates normal
low-resistance arterial and venous waveforms.

Other findings

No abnormal free fluid.
IMPRESSION: 1. No acute ultrasound findings of the pelvis.
2. No evidence of pelvic or adnexal fluid collection, per reported
indication of PID.
3. Multiple uterine fibroids, particularly a submucosal fibroid of
the uterine fundus measuring 1.5 cm.

## 2023-10-22 ENCOUNTER — Encounter: Payer: Self-pay | Admitting: Obstetrics and Gynecology

## 2023-11-22 ENCOUNTER — Encounter: Payer: Self-pay | Admitting: Obstetrics and Gynecology

## 2023-11-22 ENCOUNTER — Ambulatory Visit (INDEPENDENT_AMBULATORY_CARE_PROVIDER_SITE_OTHER): Admitting: Obstetrics and Gynecology

## 2023-11-22 ENCOUNTER — Other Ambulatory Visit: Payer: Self-pay

## 2023-11-22 VITALS — BP 126/87 | HR 77 | Ht 62.0 in | Wt 127.2 lb

## 2023-11-22 DIAGNOSIS — D219 Benign neoplasm of connective and other soft tissue, unspecified: Secondary | ICD-10-CM

## 2023-11-22 DIAGNOSIS — D500 Iron deficiency anemia secondary to blood loss (chronic): Secondary | ICD-10-CM | POA: Diagnosis not present

## 2023-11-22 DIAGNOSIS — N920 Excessive and frequent menstruation with regular cycle: Secondary | ICD-10-CM

## 2023-11-22 DIAGNOSIS — N946 Dysmenorrhea, unspecified: Secondary | ICD-10-CM

## 2023-11-22 MED ORDER — MEGESTROL ACETATE 40 MG PO TABS
40.0000 mg | ORAL_TABLET | Freq: Two times a day (BID) | ORAL | 5 refills | Status: AC
Start: 2023-11-22 — End: ?

## 2023-11-22 NOTE — Patient Instructions (Signed)
 [email or web address]

## 2023-11-22 NOTE — Progress Notes (Unsigned)
 Monthly menses  usually last 8-9 days Passes clots 3 known fibroids   CC: surgical consult Subjective:    Patient ID: Emily Rose, female    DOB: [birth year], 38 y.o.   MRN: [id number]  HPI 38 yo [id number]  seen for consultation regarding treatment of menorrhagia secondary to fibroid uterus.  Pt has three known fibroids.  Pt has monthly menses but they last 8-9 days.  Pt passes clots during menses.  Pt has received iron infusions in the past.   Review of Systems     Objective:   Physical Exam Vitals:   11/22/23 1414  BP: 126/87  Pulse: 77   CLINICAL DATA:  Pelvic pain   EXAM: TRANSABDOMINAL AND TRANSVAGINAL ULTRASOUND OF PELVIS   DOPPLER ULTRASOUND OF OVARIES   TECHNIQUE: Both transabdominal and transvaginal ultrasound examinations of the pelvis were performed. Transabdominal technique was performed for global imaging of the pelvis including uterus, ovaries, adnexal regions, and pelvic cul-de-sac.   It was necessary to proceed with endovaginal exam following the transabdominal exam to visualize the ovaries. Color and duplex Doppler ultrasound was utilized to evaluate blood flow to the ovaries.   COMPARISON:  03/22/2021 and older   FINDINGS: Uterus   Measurements: 9.9 x 6.3 x 6.7 = volume: 216.5 mL. Heterogeneous myometrium with focal fibroids. Example anterior midbody measuring 3.7 x 2.9 x 3.4 cm. Previously this measured 3.9 x 3.2 x 4.1 cm. Posterior fundal area measures 3.0 x 2.2 x 2.9 cm. There is also a submucosal fibroid centrally measuring 18 x 11 x 17 mm with some distortion of the underlying endometrium.   Endometrium   Thickness: 24. Focal mass lesion consistent with a subendometrial fibroid measuring 18 x 11 x 17 mm with distortion.   Right ovary   Measurements: 3.4 x 1.2 x 1.0 = volume: 2.1 mL. Normal appearance/no adnexal mass.   Left ovary   Measurements: 2.9 x 1.5 x 2.5 = volume: 5.2 mL. Normal appearance/no adnexal mass.   Pulsed Doppler  evaluation of both ovaries demonstrates normal low-resistance arterial and venous waveforms.   Other findings   Small amount of free fluid in the pelvis.   IMPRESSION: Multiple uterine fibroids. There is 1 fibroid as well obscuring the thickened endometrium, submucosal component. Small amount of free fluid in the pelvis. If there is further concern of the distribution of fibroids in the associated endometrium, female pelvic MRI could be performed as clinically indicated and when appropriate          Assessment & Plan:   1. Dysmenorrhea (Primary)   2. Menorrhagia with regular cycle Megace  given to decrease bleeding until surgical intervention Options given for treatment OCPs are not an option due to smoking and age Patient has failed lysteda   1.UFE 2.Sonata 3. Uterine ablation 4.myomectomy 5. Hysterectomy   Pt desires Sonata procedure as she would like to potentially like to leave the door open for fertility Referral sent to schedule   - CBC - megestrol  (MEGACE ) 40 MG tablet; Take 1 tablet (40 mg total) by mouth 2 (two) times daily. Can increase to two tablets twice a day in the event of heavy bleeding  Dispense: 60 tablet; Refill: 5 - Ambulatory Referral For Surgery Scheduling  3. Iron deficiency anemia due to chronic blood loss  - CBC - megestrol  (MEGACE ) 40 MG tablet; Take 1 tablet (40 mg total) by mouth 2 (two) times daily. Can increase to two tablets twice a day in the event of heavy bleeding  Dispense: 60 tablet;  Refill: 5  4. Fibroid See above  - Ambulatory Referral For Surgery Scheduling   I spent 30 minutes dedicated to the care of this patient including previsit review of records, face to face time with the patient discussing treatment options and post visit testing.  Jerilynn DELENA Buddle, MD Faculty Attending, [facility]

## 2023-11-23 LAB — CBC
Hematocrit: 29.7 % — ABNORMAL LOW (ref 34.0–46.6)
Hemoglobin: 9.5 g/dL — ABNORMAL LOW (ref 11.1–15.9)
MCH: 30.1 pg (ref 26.6–33.0)
MCHC: 32 g/dL (ref 31.5–35.7)
MCV: 94 fL (ref 79–97)
Platelets: 408 x10E3/uL (ref 150–450)
RBC: 3.16 x10E6/uL — ABNORMAL LOW (ref 3.77–5.28)
RDW: 16.3 % — ABNORMAL HIGH (ref 11.7–15.4)
WBC: 4 x10E3/uL (ref 3.4–10.8)

## 2023-11-24 ENCOUNTER — Ambulatory Visit: Payer: Self-pay | Admitting: Obstetrics and Gynecology

## 2023-11-26 ENCOUNTER — Inpatient Hospital Stay (HOSPITAL_BASED_OUTPATIENT_CLINIC_OR_DEPARTMENT_OTHER): Admitting: Hematology and Oncology

## 2023-11-26 ENCOUNTER — Other Ambulatory Visit: Payer: Self-pay

## 2023-11-26 ENCOUNTER — Inpatient Hospital Stay: Attending: Hematology and Oncology

## 2023-11-26 ENCOUNTER — Inpatient Hospital Stay

## 2023-11-26 ENCOUNTER — Telehealth: Payer: Self-pay

## 2023-11-26 ENCOUNTER — Encounter: Payer: Self-pay | Admitting: Hematology and Oncology

## 2023-11-26 VITALS — BP 138/84 | HR 84 | Temp 98.8°F | Resp 17 | Wt 127.6 lb

## 2023-11-26 DIAGNOSIS — D219 Benign neoplasm of connective and other soft tissue, unspecified: Secondary | ICD-10-CM

## 2023-11-26 DIAGNOSIS — N920 Excessive and frequent menstruation with regular cycle: Secondary | ICD-10-CM | POA: Diagnosis not present

## 2023-11-26 DIAGNOSIS — D500 Iron deficiency anemia secondary to blood loss (chronic): Secondary | ICD-10-CM

## 2023-11-26 DIAGNOSIS — D259 Leiomyoma of uterus, unspecified: Secondary | ICD-10-CM | POA: Insufficient documentation

## 2023-11-26 DIAGNOSIS — L309 Dermatitis, unspecified: Secondary | ICD-10-CM

## 2023-11-26 LAB — PREGNANCY, URINE: Preg Test, Ur: NEGATIVE

## 2023-11-26 LAB — CBC WITH DIFFERENTIAL (CANCER CENTER ONLY)
Abs Immature Granulocytes: 0.02 K/uL (ref 0.00–0.07)
Basophils Absolute: 0.1 K/uL (ref 0.0–0.1)
Basophils Relative: 2 %
Eosinophils Absolute: 0.3 K/uL (ref 0.0–0.5)
Eosinophils Relative: 7 %
HCT: 31.6 % — ABNORMAL LOW (ref 36.0–46.0)
Hemoglobin: 10.3 g/dL — ABNORMAL LOW (ref 12.0–15.0)
Immature Granulocytes: 0 %
Lymphocytes Relative: 26 %
Lymphs Abs: 1.2 K/uL (ref 0.7–4.0)
MCH: 29.8 pg (ref 26.0–34.0)
MCHC: 32.6 g/dL (ref 30.0–36.0)
MCV: 91.3 fL (ref 80.0–100.0)
Monocytes Absolute: 0.4 K/uL (ref 0.1–1.0)
Monocytes Relative: 9 %
Neutro Abs: 2.6 K/uL (ref 1.7–7.7)
Neutrophils Relative %: 56 %
Platelet Count: 581 K/uL — ABNORMAL HIGH (ref 150–400)
RBC: 3.46 MIL/uL — ABNORMAL LOW (ref 3.87–5.11)
RDW: 17.4 % — ABNORMAL HIGH (ref 11.5–15.5)
WBC Count: 4.7 K/uL (ref 4.0–10.5)
nRBC: 0 % (ref 0.0–0.2)

## 2023-11-26 LAB — SEDIMENTATION RATE: Sed Rate: 23 mm/h — ABNORMAL HIGH (ref 0–22)

## 2023-11-26 LAB — IRON AND IRON BINDING CAPACITY (CC-WL,HP ONLY)
Iron: 13 ug/dL — ABNORMAL LOW (ref 28–170)
Saturation Ratios: 3 % — ABNORMAL LOW (ref 10.4–31.8)
TIBC: 468 ug/dL — ABNORMAL HIGH (ref 250–450)
UIBC: 455 ug/dL — ABNORMAL HIGH (ref 148–442)

## 2023-11-26 LAB — TSH: TSH: 1.01 u[IU]/mL (ref 0.350–4.500)

## 2023-11-26 LAB — FERRITIN: Ferritin: 17 ng/mL (ref 11–307)

## 2023-11-26 MED ORDER — LEUPROLIDE ACETATE 3.75 MG IM KIT
3.7500 mg | PACK | Freq: Once | INTRAMUSCULAR | Status: AC
  Administered 2023-11-26: 3.75 mg via INTRAMUSCULAR
  Filled 2023-11-26: qty 3.75

## 2023-11-26 NOTE — Assessment & Plan Note (Addendum)
 Her menorrhagia is due to uterine fibroids We discussed risk and benefits of trial of Lupron injection and she is in agreement today I was able to get her authorized to proceed with Lupron injection She will return to her gynecologist for further management

## 2023-11-26 NOTE — Progress Notes (Signed)
 [facility] OFFICE PROGRESS NOTE  Emily Rose GRADE, MD  ASSESSMENT & PLAN:  Assessment & Plan Iron deficiency anemia due to chronic blood loss She tolerated IV iron well Unfortunately, she is still having significant menorrhagia Repeat iron studies and CBC confirm persistent iron deficiency anemia and she is symptomatic  We discussed some of the risks, benefits, and alternatives of intravenous iron infusions. The patient is symptomatic from anemia and the iron level is critically low. She tolerated oral iron supplement poorly and desires to achieved higher levels of iron faster for adequate hematopoesis. Some of the side-effects to be expected including risks of infusion reactions, phlebitis, headaches, nausea and fatigue.  The patient is willing to proceed. Patient education material was dispensed.  Goal is to keep ferritin level greater than 50 and resolution of anemia I recommend vitamin B12 supplement also to assist in erythropoiesis I plan to see her again in 3 months for further follow-up  Menorrhagia with regular cycle Her menorrhagia is due to uterine fibroids We discussed risk and benefits of trial of Lupron injection and she is in agreement today I was able to get her authorized to proceed with Lupron injection She will return to her gynecologist for further management    Orders Placed This Encounter  Procedures   Pregnancy, urine    Standing Status:   Future    Number of Occurrences:   1    Expected Date:   11/26/2023    Expiration Date:   11/25/2024   CBC with Differential (Cancer Center Only)    Standing Status:   Future    Expiration Date:   11/25/2024   Iron and Iron Binding Capacity ([facility] only)    Standing Status:   Future    Expiration Date:   11/25/2024   Ferritin    Standing Status:   Future    Expiration Date:   11/25/2024   Vitamin B12    Standing Status:   Future    Expiration Date:   11/25/2024    INTERVAL HISTORY: Patient returns  for recurrent anemia Symptoms of anemia includes fatigue and pallor We reviewed CBC and iron studies  SUMMARY OF HEMATOLOGIC HISTORY:  She was found to have abnormal CBC from recent blood count monitoring I have the opportunity to review his CBC dated back to February 2023 On  March 11, 2021, hemoglobin 11.1 On March 22, 2021, hemoglobin is 12.3 On October 15, 2021, hemoglobin 10.9 On February 02, 2022, hemoglobin 10.6 On December 21, 2022, hemoglobin 9.4 On August 12, 2023, hemoglobin 7.4 with elevated platelet count of 650 Iron studies confirm iron deficiency anemia with ferritin of 7, normal folate and vitamin B12 of 292 She denies recent chest pain on exertion, shortness of breath on minimal exertion, pre-syncopal episodes, or palpitations.  She complained of excessive fatigue She had not noticed any recent bleeding such as epistaxis, hematuria or hematochezia The patient denies over the counter NSAID ingestion. She is not on antiplatelets agents.  She had no prior history or diagnosis of cancer. Her age appropriate screening programs are up-to-date. She denies any pica and eats a variety of diet. She donated blood several years ago but none over the past 5 years.  She has never received blood transfusion The patient was prescribed oral iron supplements and she takes inconsistently half an hour after breakfast She received 2 doses of intravenous iron Feraheme in August 2025 and is started on Lupron injection on November 26, 2023  Lab  Results  Component Value Date   VITAMINB12 292 08/12/2023   FERRITIN 7 (L) 08/12/2023   HGB 10.3 (L) 11/26/2023   RBC 3.46 (L) 11/26/2023   Vitals:   11/26/23 1151  BP: 138/84  Pulse: 84  Resp: 17  Temp: 98.8 F (37.1 C)  SpO2: 99%

## 2023-11-26 NOTE — Assessment & Plan Note (Addendum)
 She tolerated IV iron well Unfortunately, she is still having significant menorrhagia Repeat iron studies and CBC confirm persistent iron deficiency anemia and she is symptomatic  We discussed some of the risks, benefits, and alternatives of intravenous iron infusions. The patient is symptomatic from anemia and the iron level is critically low. She tolerated oral iron supplement poorly and desires to achieved higher levels of iron faster for adequate hematopoesis. Some of the side-effects to be expected including risks of infusion reactions, phlebitis, headaches, nausea and fatigue.  The patient is willing to proceed. Patient education material was dispensed.  Goal is to keep ferritin level greater than 50 and resolution of anemia I recommend vitamin B12 supplement also to assist in erythropoiesis I plan to see her again in 3 months for further follow-up

## 2023-11-26 NOTE — Telephone Encounter (Signed)
 Dr. Lonn, patient will be scheduled as soon as possible.  Auth Submission: NO AUTH NEEDED Site of care: Site of care: [facility] Payer: [employer] medicaid Medication & CPT/J Code(s) submitted: Feraheme (ferumoxytol ) [id number] Diagnosis Code:  Route of submission (phone, fax, portal):  Phone # Fax # Auth type: Buy/Bill PB Units/visits requested: 510mg  x 2 doses Reference number:  Approval from: 11/26/23 to 01/19/24

## 2023-12-03 ENCOUNTER — Ambulatory Visit

## 2023-12-03 VITALS — BP 125/84 | HR 75 | Temp 98.3°F | Resp 18 | Ht 62.0 in | Wt 123.8 lb

## 2023-12-03 DIAGNOSIS — D500 Iron deficiency anemia secondary to blood loss (chronic): Secondary | ICD-10-CM

## 2023-12-03 MED ORDER — SODIUM CHLORIDE 0.9 % IV SOLN
510.0000 mg | Freq: Once | INTRAVENOUS | Status: AC
  Administered 2023-12-03: 510 mg via INTRAVENOUS
  Filled 2023-12-03: qty 17

## 2023-12-03 MED ORDER — DIPHENHYDRAMINE HCL 25 MG PO CAPS
25.0000 mg | ORAL_CAPSULE | Freq: Once | ORAL | Status: AC
  Administered 2023-12-03: 25 mg via ORAL
  Filled 2023-12-03: qty 1

## 2023-12-03 MED ORDER — ACETAMINOPHEN 325 MG PO TABS
650.0000 mg | ORAL_TABLET | Freq: Once | ORAL | Status: AC
  Administered 2023-12-03: 650 mg via ORAL
  Filled 2023-12-03: qty 2

## 2023-12-03 NOTE — Progress Notes (Signed)
 Diagnosis: Iron Deficiency Anemia  Provider:  Mannam, Praveen MD  Procedure: IV Infusion  IV Type: Peripheral, IV Location: L Antecubital  Feraheme (Ferumoxytol ), Dose: 510 mg  Infusion Start Time: 1229 pm  Infusion Stop Time: 1253 pm  Post Infusion IV Care: Observation period completed and Peripheral IV Discontinued  Discharge: Condition: Good, Destination: Home . AVS Declined  Performed by:  Trudy Lamarr LABOR, RN

## 2023-12-06 ENCOUNTER — Ambulatory Visit: Payer: Self-pay | Admitting: Hematology and Oncology

## 2023-12-10 ENCOUNTER — Ambulatory Visit (INDEPENDENT_AMBULATORY_CARE_PROVIDER_SITE_OTHER)

## 2023-12-10 VITALS — BP 132/87 | HR 72 | Temp 98.1°F | Resp 16 | Ht 62.0 in | Wt 129.4 lb

## 2023-12-10 DIAGNOSIS — D500 Iron deficiency anemia secondary to blood loss (chronic): Secondary | ICD-10-CM | POA: Diagnosis not present

## 2023-12-10 MED ORDER — DIPHENHYDRAMINE HCL 25 MG PO CAPS
25.0000 mg | ORAL_CAPSULE | Freq: Once | ORAL | Status: AC
  Administered 2023-12-10: 25 mg via ORAL
  Filled 2023-12-10: qty 1

## 2023-12-10 MED ORDER — ACETAMINOPHEN 325 MG PO TABS
650.0000 mg | ORAL_TABLET | Freq: Once | ORAL | Status: AC
  Administered 2023-12-10: 650 mg via ORAL
  Filled 2023-12-10: qty 2

## 2023-12-10 MED ORDER — SODIUM CHLORIDE 0.9 % IV SOLN
510.0000 mg | Freq: Once | INTRAVENOUS | Status: AC
  Administered 2023-12-10: 510 mg via INTRAVENOUS
  Filled 2023-12-10: qty 17

## 2023-12-10 NOTE — Progress Notes (Signed)
 Diagnosis: Iron Deficiency Anemia  Provider:  Praveen Mannam MD  Procedure: IV Infusion  IV Type: Peripheral, IV Location: L Antecubital  Feraheme  (Ferumoxytol ), Dose: 510 mg  Infusion Start Time: 1224  Infusion Stop Time: 1240  Post Infusion IV Care: Observation period completed and Peripheral IV Discontinued  Discharge: Condition: Good, Destination: Home . AVS Declined  Performed by:  Aleesia Henney, RN

## 2024-01-03 ENCOUNTER — Encounter: Payer: Self-pay | Admitting: *Deleted

## 2024-02-09 ENCOUNTER — Telehealth: Payer: Self-pay

## 2024-02-09 NOTE — Telephone Encounter (Signed)
 I called patient to see if she's available for surgery w/ Dr. Zina on 03/07/24 at 10:45 am? I left a voicemail requesting a call back to schedule.

## 2024-02-09 NOTE — Telephone Encounter (Signed)
 Patient left a voicemail stating that she would like her surgery scheduled with Dr. Zina on 03/07/24. Pre-Op instructions and surgery details delivered via Mychart.

## 2024-02-16 ENCOUNTER — Telehealth: Payer: Self-pay

## 2024-02-16 NOTE — Telephone Encounter (Signed)
 I received patient's voicemail asking if surgery was scheduled for 03/07/24? I called her back and confirmed details. I also reminded her that the surgery letter and pre-op instructions were sent on 02/09/24.

## 2024-02-24 ENCOUNTER — Telehealth: Payer: Self-pay | Admitting: Family Medicine

## 2024-02-24 NOTE — Telephone Encounter (Signed)
 Attempted to call patient to schedule preop appt with Dr.Bass. Was unable to reach pt. Left detailed message about appointment being scheduled for next week. Also mailed appt reminder to address on file.

## 2024-02-25 ENCOUNTER — Inpatient Hospital Stay: Admitting: Hematology and Oncology

## 2024-02-25 ENCOUNTER — Inpatient Hospital Stay: Attending: Hematology and Oncology

## 2024-03-02 ENCOUNTER — Ambulatory Visit: Payer: Self-pay | Admitting: Obstetrics and Gynecology

## 2024-03-07 ENCOUNTER — Ambulatory Visit (HOSPITAL_COMMUNITY): Admit: 2024-03-07 | Admitting: Obstetrics and Gynecology

## 2024-03-07 ENCOUNTER — Encounter (HOSPITAL_COMMUNITY): Admission: RE | Payer: Self-pay | Source: Home / Self Care

## 2024-03-07 SURGERY — RADIOFREQUENCY ABLATION, LEIOMYOMA, UTERUS, TRANSCERVICAL APPROACH, WITH US GUIDANCE
Anesthesia: Choice

## 2024-03-22 ENCOUNTER — Ambulatory Visit: Payer: Self-pay | Admitting: Family Medicine

## 2024-03-29 ENCOUNTER — Ambulatory Visit: Payer: Self-pay | Admitting: Family Medicine
